# Patient Record
Sex: Female | Born: 1980 | Hispanic: Yes | Smoking: Never smoker
Health system: Southern US, Community
[De-identification: ages and names within clinical notes are randomized; demographics above are authoritative.]

---

## 2009-02-07 ENCOUNTER — Ambulatory Visit: Payer: Self-pay | Admitting: Family Medicine

## 2009-02-26 ENCOUNTER — Inpatient Hospital Stay: Payer: Self-pay

## 2010-04-08 ENCOUNTER — Emergency Department: Payer: Self-pay | Admitting: Unknown Physician Specialty

## 2017-12-10 LAB — OB RESULTS CONSOLE HEPATITIS B SURFACE ANTIGEN: HEP B S AG: NEGATIVE

## 2017-12-10 LAB — OB RESULTS CONSOLE RPR: RPR: NONREACTIVE

## 2017-12-27 ENCOUNTER — Other Ambulatory Visit: Payer: Self-pay | Admitting: Physician Assistant

## 2017-12-27 DIAGNOSIS — Z369 Encounter for antenatal screening, unspecified: Secondary | ICD-10-CM

## 2018-01-06 ENCOUNTER — Ambulatory Visit (HOSPITAL_BASED_OUTPATIENT_CLINIC_OR_DEPARTMENT_OTHER)
Admission: RE | Admit: 2018-01-06 | Discharge: 2018-01-06 | Disposition: A | Discharge status: Home / Self Care | Payer: Self-pay | Source: Ambulatory Visit | Attending: Physician Assistant | Admitting: Physician Assistant

## 2018-01-06 ENCOUNTER — Encounter: Payer: Self-pay | Admitting: *Deleted

## 2018-01-06 ENCOUNTER — Ambulatory Visit
Admission: RE | Admit: 2018-01-06 | Discharge: 2018-01-06 | Disposition: A | Discharge status: Home / Self Care | Payer: Self-pay | Source: Ambulatory Visit | Attending: Maternal & Fetal Medicine | Admitting: Maternal & Fetal Medicine

## 2018-01-06 DIAGNOSIS — Z3A13 13 weeks gestation of pregnancy: Secondary | ICD-10-CM | POA: Insufficient documentation

## 2018-01-06 DIAGNOSIS — O09521 Supervision of elderly multigravida, first trimester: Secondary | ICD-10-CM

## 2018-01-06 DIAGNOSIS — Z369 Encounter for antenatal screening, unspecified: Secondary | ICD-10-CM

## 2018-01-06 DIAGNOSIS — Z3401 Encounter for supervision of normal first pregnancy, first trimester: Secondary | ICD-10-CM | POA: Insufficient documentation

## 2018-01-06 NOTE — Progress Notes (Signed)
Referring Provider:  Terance Ice Length of Consultation: 80 minutes  [name] was referred to [facility] for genetic counseling because of advanced maternal age.  The patient will be 37 years old at the time of delivery.  This note summarizes the information we discussed with the aid of a Spanish interpreter.    We explained that the chance of a chromosome abnormality increases with maternal age.  Chromosomes and examples of chromosome problems were reviewed.  Humans typically have 46 chromosomes in each cell, with half passed through each sperm and egg.  Any change in the number or structure of chromosomes can increase the risk of problems in the physical and mental development of a pregnancy.   Based upon age of the patient, the chance of any chromosome abnormality was 1 in 36. The chance of Down syndrome, the most common chromosome problem associated with maternal age, was 1 in 30.  The risk of chromosome problems is in addition to the 3% general population risk for birth defects and mental retardation.  The greatest chance, of course, is that the baby would be born in good health.  We discussed the following prenatal screening and testing options for this pregnancy:  First trimester screening, which includes nuchal translucency ultrasound screen and first trimester maternal serum marker screening.  The nuchal translucency has approximately an 80% detection rate for Down syndrome and can be positive for other chromosome abnormalities as well as heart defects.  When combined with a maternal serum marker screening, the detection rate is up to 90% for Down syndrome and up to 97% for trisomy 18.     The chorionic villus sampling procedure is available for first trimester chromosome analysis.  This involves the withdrawal of a small amount of chorionic villi (tissue from the developing placenta).  Risk of pregnancy loss is estimated to be approximately 1  in 200 to 1 in 100 (0.5 to 1%).  There is approximately a 1% (1 in 100) chance that the CVS chromosome results will be unclear.  Chorionic villi cannot be tested for neural tube defects.     Maternal serum marker screening, a blood test that measures pregnancy proteins, can provide risk assessments for Down syndrome, trisomy 18, and open neural tube defects (spina bifida, anencephaly). Because it does not directly examine the fetus, it cannot positively diagnose or rule out these problems.  Targeted ultrasound uses high frequency sound waves to create an image of the developing fetus.  An ultrasound is often recommended as a routine means of evaluating the pregnancy.  It is also used to screen for fetal anatomy problems (for example, a heart defect) that might be suggestive of a chromosomal or other abnormality.   Amniocentesis involves the removal of a small amount of amniotic fluid from the sac surrounding the fetus with the use of a thin needle inserted through the maternal abdomen and uterus.  Ultrasound guidance is used throughout the procedure.  Fetal cells from amniotic fluid are directly evaluated and > 99.5% of chromosome problems and > 98% of open neural tube defects can be detected. This procedure is generally performed after the 15th week of pregnancy.  The main risks to this procedure include complications leading to miscarriage in less than 1 in 200 cases (0.5%).  We also reviewed the availability of cell free fetal DNA testing from maternal blood to determine whether or not the baby may have either Down syndrome, trisomy 2, or trisomy 31.  This  test utilizes a maternal blood sample and DNA sequencing technology to isolate circulating cell free fetal DNA from maternal plasma.  The fetal DNA can then be analyzed for DNA sequences that are derived from the three most common chromosomes involved in aneuploidy, chromosomes 13, 18, and 21.  If the overall amount of DNA is greater than the expected  level for any of these chromosomes, aneuploidy is suspected.  While we do not consider it a replacement for invasive testing and karyotype analysis, a negative result from this testing would be reassuring, though not a guarantee of a normal chromosome complement for the baby.  An abnormal result is certainly suggestive of an abnormal chromosome complement, though we would still recommend CVS or amniocentesis to confirm any findings from this testing.  Cystic Fibrosis and Spinal Muscular Atrophy (SMA) screening were also discussed with the patient. Both conditions are recessive, which means that both parents must be carriers in order to have a child with the disease.  Cystic fibrosis (CF) is one of the most common genetic conditions in persons of Caucasian ancestry.  This condition occurs in approximately 1 in 2,500 Caucasian persons and results in thickened secretions in the lungs, digestive, and reproductive systems.  For a baby to be at risk for having CF, both of the parents must be carriers for this condition.  Approximately 1 in 8225 Caucasian persons is a carrier for CF.  Current carrier testing looks for the most common mutations in the gene for CF and can detect approximately 90% of carriers in the Caucasian population.  This means that the carrier screening can greatly reduce, but cannot eliminate, the chance for an individual to have a child with CF.  If an individual is found to be a carrier for CF, then carrier testing would be available for the partner. As part of [place]'s newborn screening profile, all babies born in the state of [place] will have a two-tier screening process.  Specimens are first tested to determine the concentration of immunoreactive trypsinogen (IRT).  The top 5% of specimens with the highest IRT values then undergo DNA testing using a panel of over 40 common CF mutations. SMA is a neurodegenerative disorder that leads to atrophy of skeletal muscle and overall  weakness.  This condition is also more prevalent in the Caucasian population, with 1 in 40-1 in 60 persons being a carrier and 1 in 6,000-1 in 10,000 children being affected.  There are multiple forms of the disease, with some causing death in infancy to other forms with survival into adulthood.  The genetics of SMA is complex, but carrier screening can detect up to 95% of carriers in the Caucasian population.  Similar to CF, a negative result can greatly reduce, but cannot eliminate, the chance to have a child with SMA.  We obtained a detailed family history and pregnancy history.  Ms. Sharon MilanBahena Sharon reported that her brother has a 35 year old son with a congenital heart defect, intestinal issues requiring surgery, delays with speaking and possibly slanted eyes.  She indicated that here had been some question about Down syndrome, but that the family has not stated that any specific underlying cause is known. The child's mother was 508 years old at the time of his delivery.  There are no other family members with similar health concerns.  We discussed that there may be various reasons for the conditions described.  Though babies with Down syndrome may have these health concerns, there are also other  conditions that could be the cause.  Without additional medical information, it is difficult to provide an accurate recurrence risk assessment for other family members including this pregnancy.  We can offer an anatomy ultrasound to evaluate for structural differences including heart defects and possibly anomalies of the intestines.  We encouraged the patient to speak with her brother and let us know any additional information.  The remainder of the family history is unremarkable for birth defects, developmental delays, recurrent pregnancy loss or known chromosome abnormalities.  [name] stated that this is the third pregnancy for she and her partner.  She reported no complications or exposures to medications,  recreational drugs, smoking or alcohol.  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  She has thus far tried to minimize her exposures there.  We encouraged her to use good safety precautions such as gloves, hand washing and masks and to avoid sniffing any chemicals for recreational purposes, which should minimize any exposure to the pregnancy.  After consideration of the options, [name] elected to proceed with cell free fetal DNA testing and an ultrasound.   An ultrasound was performed at the time of the visit.  The gestational age was consistent with  13 weeks.  Fetal anatomy could not be assessed due to early gestational age.  Please refer to the ultrasound report for details of that study.  [name] was scheduled to return here for a detailed anatomy ultrasound at [redacted] weeks gestation.  Of note, this is after her presumptive medicaid expires but she was only going to be 15 weeks at the time it ends, which is too early to expect appropriate views of the fetal anatomy.  [name] was encouraged to call with questions or concerns.  We can be contacted at [phone].   Tests Ordered: MaterniT21 PLUS with [facility]    Cherly Anderson, MS, CGC

## 2018-01-12 LAB — MATERNIT21 PLUS CORE+SCA
CHROMOSOME 13: NEGATIVE
CHROMOSOME 18: NEGATIVE
CHROMOSOME 21: NEGATIVE
Y CHROMOSOME: DETECTED

## 2018-01-17 ENCOUNTER — Telehealth: Payer: Self-pay | Admitting: Obstetrics and Gynecology

## 2018-01-17 NOTE — Telephone Encounter (Signed)
The patient was informed of the results of her recent MaterniT21 testing which yielded NEGATIVE results.  The patient's specimen showed DNA consistent with two copies of chromosomes 21, 18 and 13.  The sensitivity for trisomy 21, trisomy 18 and trisomy 13 using this testing are reported as 99.1%, 99.9% and 91.7% respectively.  Thus, while the results of this testing are highly accurate, they are not considered diagnostic at this time.  Should more definitive information be desired, the patient may still consider amniocentesis.   As requested to know by the patient, sex chromosome analysis was included for this sample.  Results was consistent with a female fetus. This is predicted with >99% accuracy.  A maternal serum AFP only should be considered if screening for neural tube defects is desired.   Othel Hoogendoorn F. Suann Klier, MS, CGC   

## 2018-02-07 ENCOUNTER — Other Ambulatory Visit: Payer: Self-pay | Admitting: *Deleted

## 2018-02-07 DIAGNOSIS — O09521 Supervision of elderly multigravida, first trimester: Secondary | ICD-10-CM

## 2018-02-10 ENCOUNTER — Ambulatory Visit
Admission: RE | Admit: 2018-02-10 | Discharge: 2018-02-10 | Disposition: A | Discharge status: Home / Self Care | Payer: Self-pay | Source: Ambulatory Visit | Attending: Maternal & Fetal Medicine | Admitting: Maternal & Fetal Medicine

## 2018-02-10 DIAGNOSIS — O3482 Maternal care for other abnormalities of pelvic organs, second trimester: Secondary | ICD-10-CM | POA: Insufficient documentation

## 2018-02-10 DIAGNOSIS — N83292 Other ovarian cyst, left side: Secondary | ICD-10-CM | POA: Insufficient documentation

## 2018-02-10 DIAGNOSIS — Z3A18 18 weeks gestation of pregnancy: Secondary | ICD-10-CM | POA: Insufficient documentation

## 2018-02-10 DIAGNOSIS — O09521 Supervision of elderly multigravida, first trimester: Secondary | ICD-10-CM

## 2018-02-10 DIAGNOSIS — O09522 Supervision of elderly multigravida, second trimester: Secondary | ICD-10-CM | POA: Insufficient documentation

## 2018-02-14 ENCOUNTER — Encounter: Payer: Self-pay | Admitting: Emergency Medicine

## 2018-02-14 ENCOUNTER — Other Ambulatory Visit: Payer: Self-pay

## 2018-02-14 ENCOUNTER — Emergency Department
Admission: EM | Admit: 2018-02-14 | Discharge: 2018-02-14 | Disposition: A | Discharge status: Home / Self Care | Payer: Self-pay | Attending: Emergency Medicine | Admitting: Emergency Medicine

## 2018-02-14 DIAGNOSIS — O26899 Other specified pregnancy related conditions, unspecified trimester: Secondary | ICD-10-CM | POA: Insufficient documentation

## 2018-02-14 DIAGNOSIS — O2340 Unspecified infection of urinary tract in pregnancy, unspecified trimester: Secondary | ICD-10-CM | POA: Insufficient documentation

## 2018-02-14 DIAGNOSIS — R197 Diarrhea, unspecified: Secondary | ICD-10-CM

## 2018-02-14 DIAGNOSIS — N390 Urinary tract infection, site not specified: Secondary | ICD-10-CM

## 2018-02-14 DIAGNOSIS — Z3A18 18 weeks gestation of pregnancy: Secondary | ICD-10-CM | POA: Insufficient documentation

## 2018-02-14 DIAGNOSIS — R112 Nausea with vomiting, unspecified: Secondary | ICD-10-CM

## 2018-02-14 DIAGNOSIS — O219 Vomiting of pregnancy, unspecified: Secondary | ICD-10-CM | POA: Insufficient documentation

## 2018-02-14 LAB — URINALYSIS, COMPLETE (UACMP) WITH MICROSCOPIC
Bilirubin Urine: NEGATIVE
Glucose, UA: NEGATIVE mg/dL
Hgb urine dipstick: NEGATIVE
Ketones, ur: NEGATIVE mg/dL
Nitrite: NEGATIVE
PROTEIN: NEGATIVE mg/dL
Specific Gravity, Urine: 1.005 (ref 1.005–1.030)
pH: 7 (ref 5.0–8.0)

## 2018-02-14 LAB — COMPREHENSIVE METABOLIC PANEL
ALK PHOS: 45 U/L (ref 38–126)
ALT: 19 U/L (ref 14–54)
AST: 22 U/L (ref 15–41)
Albumin: 3.5 g/dL (ref 3.5–5.0)
Anion gap: 7 (ref 5–15)
BUN: 7 mg/dL (ref 6–20)
CALCIUM: 8.6 mg/dL — AB (ref 8.9–10.3)
CHLORIDE: 108 mmol/L (ref 101–111)
CO2: 20 mmol/L — ABNORMAL LOW (ref 22–32)
CREATININE: 0.43 mg/dL — AB (ref 0.44–1.00)
GFR calc Af Amer: >60 mL/min (ref >60)
GFR calc non Af Amer: >60 mL/min (ref >60)
Glucose, Bld: 96 mg/dL (ref 65–99)
Potassium: 3.3 mmol/L — ABNORMAL LOW (ref 3.5–5.1)
SODIUM: 135 mmol/L (ref 135–145)
Total Bilirubin: 0.6 mg/dL (ref 0.3–1.2)
Total Protein: 6.7 g/dL (ref 6.5–8.1)

## 2018-02-14 LAB — CBC
HCT: 35.4 % (ref 35.0–47.0)
HEMOGLOBIN: 12.5 g/dL (ref 12.0–16.0)
MCH: 30.2 pg (ref 26.0–34.0)
MCHC: 35.2 g/dL (ref 32.0–36.0)
MCV: 85.7 fL (ref 80.0–100.0)
PLATELETS: 230 10*3/uL (ref 150–440)
RBC: 4.14 MIL/uL (ref 3.80–5.20)
RDW: 14.4 % (ref 11.5–14.5)
WBC: 11.6 10*3/uL — ABNORMAL HIGH (ref 3.6–11.0)

## 2018-02-14 LAB — CK: CK TOTAL: 84 U/L (ref 38–234)

## 2018-02-14 LAB — HCG, QUANTITATIVE, PREGNANCY: hCG, Beta Chain, Quant, S: 15109 m[IU]/mL — ABNORMAL HIGH (ref <5)

## 2018-02-14 LAB — LIPASE, BLOOD: LIPASE: 19 U/L (ref 11–51)

## 2018-02-14 MED ORDER — NITROFURANTOIN MONOHYD MACRO 100 MG PO CAPS: 100.0000 mg | ORAL_CAPSULE | Freq: Two times a day (BID) | ORAL | 0 refills | Status: AC

## 2018-02-14 MED ORDER — ONDANSETRON 4 MG PO TBDP: 4.0000 mg | ORAL_TABLET | Freq: Three times a day (TID) | ORAL | 0 refills | Status: AC | PRN

## 2018-02-14 MED ORDER — ONDANSETRON HCL 4 MG/2ML IJ SOLN
4.0000 mg | Freq: Once | INTRAMUSCULAR | Status: AC
  Administered 2018-02-14: 4 mg via INTRAVENOUS
  Filled 2018-02-14: qty 2

## 2018-02-14 MED ORDER — SODIUM CHLORIDE 0.9 % IV BOLUS
1000.0000 mL | Freq: Once | INTRAVENOUS | Status: AC
  Administered 2018-02-14: 1000 mL via INTRAVENOUS

## 2018-02-14 MED ORDER — SODIUM CHLORIDE 0.9 % IV SOLN
1.0000 g | Freq: Once | INTRAVENOUS | Status: AC
  Administered 2018-02-14: 1 g via INTRAVENOUS
  Filled 2018-02-14: qty 10

## 2018-02-14 NOTE — ED Triage Notes (Signed)
Through interpretor, Patient is [redacted] weeks pregnant. Presents to ED for complaints of diarrhea and nausea. Onset Sunday afternoon. Appetite is decreased. States she was outside in the heat for most of the day and despite drinking Gatorade she started having nausea. Thinks the heat may have caused all these symptoms. Denies any pregnancy related complaints

## 2018-02-14 NOTE — Discharge Instructions (Addendum)
1.  Take antibiotic as prescribed (Macrobid 100 mg twice daily for 5 days). 2.  You may take Zofran as needed for nausea/vomiting. 3.  Clear liquids for the next 12 hours, then BRAT diet for the next 3 days, then slowly advance diet as tolerated.  Avoid greasy, fatty, spicy foods and alcohol. 4.  Return to the ER for worsening symptoms, persistent vomiting, difficulty breathing or other concerns.

## 2018-02-14 NOTE — ED Provider Notes (Signed)
[facility] Emergency Department Provider Note   ____________________________________________   First MD Initiated Contact with Patient 02/14/18 0142     (approximate)  I have reviewed the triage vital signs and the nursing notes.   HISTORY  Chief Complaint Diarrhea and Nausea  History obtained Via [employer] Spanish interpreter  HPI [name] is a 37 y.o. female who presents to the ED from home with a chief complaint of nausea/vomiting/diarrhea.  Patient is G3 P2 approximately [redacted] weeks pregnant who states she was outside in the heat for most of the day yesterday.  Despite hydrating with Gatorade, subsequently she began to have nausea and vomiting.  States diarrhea is worse than the vomiting.  Denies associated fever, chills, chest pain, shortness of breath, abdominal pain, vaginal bleeding, dysuria.  Denies recent travel or trauma.   Past medical history None   Patient Active Problem List   Diagnosis Date Noted  . Advanced maternal age in multigravida, first trimester     History reviewed. No pertinent surgical history.  Prior to Admission medications   Medication Sig Start Date End Date Taking? Authorizing Provider  Prenatal Vit-Fe Fumarate-FA (PRENATAL MULTIVITAMIN) TABS tablet Take 1 tablet by mouth daily at 12 noon.   Yes [provider]    Allergies Patient has no known allergies.  No family history on file.  Social History Social History   Tobacco Use  . Smoking status: Never Smoker  . Smokeless tobacco: Never Used  Substance Use Topics  . Alcohol use: Never    Frequency: Never  . Drug use: Never    Review of Systems  Constitutional: No fever/chills. Eyes: No visual changes. ENT: No sore throat. Cardiovascular: Denies chest pain. Respiratory: Denies shortness of breath. Gastrointestinal: No abdominal pain.  Via, vomiting and diarrhea.  No constipation. Genitourinary: Negative for  dysuria. Musculoskeletal: Negative for back pain. Skin: Negative for rash. Neurological: Negative for headaches, focal weakness or numbness.   ____________________________________________   PHYSICAL EXAM:  VITAL SIGNS: ED Triage Vitals [02/14/18 0113]  Enc Vitals Group     BP      Pulse      Resp      Temp      Temp src      SpO2      Weight 145 lb (65.8 kg)     Height  (1.6 m)     Head Circumference      Peak Flow      Pain Score 0     Pain Loc      Pain Edu?      Excl. in GC?     Constitutional: Alert and oriented. Well appearing and in no acute distress. Eyes: Conjunctivae are normal. PERRL. EOMI. Head: Atraumatic. Nose: No congestion/rhinnorhea. Mouth/Throat: Mucous membranes are moist.  Oropharynx non-erythematous. Neck: No stridor.   Cardiovascular: Normal rate, regular rhythm. Grossly normal heart sounds.  Good peripheral circulation. Respiratory: Normal respiratory effort.  No retractions. Lungs CTAB. Gastrointestinal: Gravid.  Soft and nontender to light or deep palpation. No distention. No abdominal bruits. No CVA tenderness. Musculoskeletal: No lower extremity tenderness nor edema.  No joint effusions. Neurologic:  Normal speech and language. No gross focal neurologic deficits are appreciated. No gait instability. Skin:  Skin is warm, dry and intact. No rash noted. Psychiatric: Mood and affect are normal. Speech and behavior are normal.  ____________________________________________   LABS (all labs ordered are listed, but only abnormal results are displayed)  Labs Reviewed  CBC - Abnormal; Notable for the following components:      Result Value   WBC 11.6 (*)    All other components within normal limits  COMPREHENSIVE METABOLIC PANEL - Abnormal; Notable for the following components:   Potassium 3.3 (*)    CO2 20 (*)    Creatinine, Ser 0.43 (*)    Calcium 8.6 (*)    All other components within normal limits  HCG, QUANTITATIVE, PREGNANCY -  Abnormal; Notable for the following components:   hCG, Beta Chain, Quant, S 15,109 (*)    All other components within normal limits  URINALYSIS, COMPLETE (UACMP) WITH MICROSCOPIC - Abnormal; Notable for the following components:   Color, Urine STRAW (*)    APPearance CLEAR (*)    Leukocytes, UA SMALL (*)    Bacteria, UA RARE (*)    All other components within normal limits  LIPASE, BLOOD  CK   ____________________________________________  EKG  None ____________________________________________  RADIOLOGY  ED MD interpretation: None  Official radiology report(s): No results found.  ____________________________________________   PROCEDURES  Procedure(s) performed: None  Procedures  Critical Care performed: No  ____________________________________________   INITIAL IMPRESSION / ASSESSMENT AND PLAN / ED COURSE  As part of my medical decision making, I reviewed the following data within the electronic MEDICAL RECORD NUMBER Nursing notes reviewed and incorporated, Interpreter needed, Labs reviewed, Old chart reviewed and Notes from prior ED visits   37 year old female approximately [redacted] weeks pregnant who presents with nausea/vomiting/diarrhea.  Reports she was out in the heat all day yesterday.  Differential diagnosis includes but is not limited to gastroenteritis, HELLP syndrome, rhabdomyolysis, heat illness, etc.   Will obtain lab work, urinalysis, check CK.  Will initiate IV fluid resuscitation, IV Zofran for antiemetic and reassess.   Clinical Course as of Feb 15 343  Mon Feb 14, 2018  0344 Nausea significantly improved.  Patient tolerated ice chips without emesis.  Will discharge home with prescription for Macrobid, Zofran as needed and she will follow-up closely with her OB/GYN.  Strict return precautions given.  Patient and family member verbalize understanding and agree with plan of care.   [JS]    Clinical Course User Index [JS] Irean Hong, MD      ____________________________________________   FINAL CLINICAL IMPRESSION(S) / ED DIAGNOSES  Final diagnoses:  Nausea vomiting and diarrhea  Lower urinary tract infectious disease     ED Discharge Orders    None       Note:  This document was prepared using Dragon voice recognition software and may include unintentional dictation errors.    Irean Hong, MD 02/14/18 [phone]

## 2018-02-24 NOTE — Progress Notes (Signed)
Pt seen by me, agree with assessment and plan as outlined in CGC Wells's note 

## 2018-04-14 LAB — OB RESULTS CONSOLE HIV ANTIBODY (ROUTINE TESTING): HIV: NONREACTIVE

## 2018-06-12 LAB — OB RESULTS CONSOLE GBS: GBS: NEGATIVE

## 2018-06-15 LAB — OB RESULTS CONSOLE GC/CHLAMYDIA
CHLAMYDIA, DNA PROBE: NEGATIVE
Gonorrhea: NEGATIVE

## 2018-07-13 ENCOUNTER — Other Ambulatory Visit: Payer: Self-pay | Admitting: Obstetrics and Gynecology

## 2018-07-13 NOTE — Progress Notes (Unsigned)
Orders placed for IOL on 07/20/18

## 2018-07-18 ENCOUNTER — Other Ambulatory Visit: Payer: Self-pay

## 2018-07-18 ENCOUNTER — Inpatient Hospital Stay
Admission: EM | Admit: 2018-07-18 | Discharge: 2018-07-22 | DRG: 788 | Disposition: A | Discharge status: Home / Self Care | Payer: Medicaid Other | Attending: Obstetrics and Gynecology | Admitting: Obstetrics and Gynecology

## 2018-07-18 DIAGNOSIS — O339 Maternal care for disproportion, unspecified: Secondary | ICD-10-CM | POA: Diagnosis present

## 2018-07-18 DIAGNOSIS — Z3A41 41 weeks gestation of pregnancy: Secondary | ICD-10-CM

## 2018-07-18 DIAGNOSIS — Z3490 Encounter for supervision of normal pregnancy, unspecified, unspecified trimester: Secondary | ICD-10-CM

## 2018-07-18 DIAGNOSIS — O480 Post-term pregnancy: Secondary | ICD-10-CM | POA: Diagnosis present

## 2018-07-18 DIAGNOSIS — Z9889 Other specified postprocedural states: Secondary | ICD-10-CM

## 2018-07-18 LAB — CBC
HCT: 38.8 % (ref 36.0–46.0)
Hemoglobin: 13.3 g/dL (ref 12.0–15.0)
MCH: 29.9 pg (ref 26.0–34.0)
MCHC: 34.3 g/dL (ref 30.0–36.0)
MCV: 87.2 fL (ref 80.0–100.0)
PLATELETS: 195 10*3/uL (ref 150–400)
RBC: 4.45 MIL/uL (ref 3.87–5.11)
RDW: 14.2 % (ref 11.5–15.5)
WBC: 11.1 10*3/uL — ABNORMAL HIGH (ref 4.0–10.5)
nRBC: 0 % (ref 0.0–0.2)

## 2018-07-18 LAB — TYPE AND SCREEN
ABO/RH(D): O POS
Antibody Screen: NEGATIVE

## 2018-07-18 MED ORDER — LACTATED RINGERS IV SOLN: 500.0000 mL | INTRAVENOUS | Status: DC | PRN

## 2018-07-18 MED ORDER — ONDANSETRON HCL 4 MG/2ML IJ SOLN: 4.0000 mg | Freq: Four times a day (QID) | INTRAMUSCULAR | Status: DC | PRN

## 2018-07-18 MED ORDER — MISOPROSTOL 200 MCG PO TABS
ORAL_TABLET | ORAL | Status: AC
  Filled 2018-07-18: qty 4

## 2018-07-18 MED ORDER — SOD CITRATE-CITRIC ACID 500-334 MG/5ML PO SOLN
30.0000 mL | ORAL | Status: DC | PRN
  Administered 2018-07-19: 30 mL via ORAL
  Filled 2018-07-18: qty 30

## 2018-07-18 MED ORDER — AMMONIA AROMATIC IN INHA
RESPIRATORY_TRACT | Status: AC
  Filled 2018-07-18: qty 10

## 2018-07-18 MED ORDER — OXYTOCIN 10 UNIT/ML IJ SOLN
INTRAMUSCULAR | Status: AC
  Filled 2018-07-18: qty 2

## 2018-07-18 MED ORDER — MISOPROSTOL 25 MCG QUARTER TABLET
ORAL_TABLET | ORAL | Status: AC
  Filled 2018-07-18: qty 1

## 2018-07-18 MED ORDER — BUTORPHANOL TARTRATE 2 MG/ML IJ SOLN
1.0000 mg | INTRAMUSCULAR | Status: DC | PRN
  Administered 2018-07-19: 1 mg via INTRAVENOUS
  Filled 2018-07-18: qty 1

## 2018-07-18 MED ORDER — LIDOCAINE HCL (PF) 1 % IJ SOLN: 30.0000 mL | INTRAMUSCULAR | Status: DC | PRN

## 2018-07-18 MED ORDER — OXYTOCIN BOLUS FROM INFUSION: 500.0000 mL | Freq: Once | INTRAVENOUS | Status: DC

## 2018-07-18 MED ORDER — OXYTOCIN 40 UNITS IN LACTATED RINGERS INFUSION - SIMPLE MED
2.5000 [IU]/h | INTRAVENOUS | Status: DC
  Administered 2018-07-19: 1000 mL via INTRAVENOUS
  Filled 2018-07-18: qty 1000

## 2018-07-18 MED ORDER — SODIUM CHLORIDE 0.9 % IV SOLN: 2.0000 g | Freq: Once | INTRAVENOUS | Status: DC

## 2018-07-18 MED ORDER — MISOPROSTOL 25 MCG QUARTER TABLET
25.0000 ug | ORAL_TABLET | ORAL | Status: DC
  Administered 2018-07-18 – 2018-07-19 (×2): 25 ug via ORAL
  Filled 2018-07-18: qty 1

## 2018-07-18 MED ORDER — LIDOCAINE HCL (PF) 1 % IJ SOLN
INTRAMUSCULAR | Status: AC
  Filled 2018-07-18: qty 30

## 2018-07-18 MED ORDER — ACETAMINOPHEN 325 MG PO TABS: 650.0000 mg | ORAL_TABLET | ORAL | Status: DC | PRN

## 2018-07-18 MED ORDER — LACTATED RINGERS IV SOLN
INTRAVENOUS | Status: DC
  Administered 2018-07-18 – 2018-07-19 (×3): via INTRAVENOUS

## 2018-07-18 NOTE — H&P (Signed)
OB ADMISSION/ HISTORY & PHYSICAL:  Admission Date: 07/18/2018  4:31 PM  Admit Diagnosis: Non reactive NST  [name] is a 37 y.o. female presenting for nonreassuring fetal testing as an outpatient and found to have a late decel on monitoring in triage.  Pt was seen with medical interpreter and all her and her partner's questions were answered.  Prenatal History: [id number]   EDC : 07/10/2018, by Patient Reported  Prenatal care at  Prenatal course complicated by  - AMA - Late term pregnancy   Medical / Surgical History :  Past medical history: History reviewed. No pertinent past medical history.   Past surgical history: History reviewed. No pertinent surgical history.  Family History: History reviewed. No pertinent family history.   Social History:  reports that she has never smoked. She has never used smokeless tobacco. She reports that she does not drink alcohol or use drugs.   Allergies: Patient has no allergy information on record.    Current Medications at time of admission:  Prior to Admission medications   Medication Sig Start Date End Date Taking? Authorizing Provider  Prenatal Vit-Fe Fumarate-FA (MULTIVITAMIN-PRENATAL) 27-0.8 MG TABS tablet Take 1 tablet by mouth daily at 12 noon.   Yes [provider]     Review of Systems: Active FM  Physical Exam:  VS: Blood pressure 129/85, pulse 82, temperature 98 F (36.7 C), temperature source Oral, resp. rate 18.  General: alert and oriented, appears NAD Heart: RRR Lungs: Clear lung fields Abdomen: Gravid, soft and non-tender, non-distended / uterus: Nontender Extremities: no edema  FHT: 145, moderate variability, +accels, +late decels TOCO: q2-5 min SVE:  Dilation: Fingertip / Effacement (%): Thick /      Cephalic by leopolds  Prenatal Labs: Blood type/Rh  Rh positive  Antibody screen neg  Rubella Immune  Varicella Immune  RPR NR  HBsAg Neg  HIV NR  GC neg  Chlamydia neg  Genetic  screening negative  1 hour GTT Unknown but not noted positive in records  3 hour GTT n/a  GBS neg   No results found. - pending  Assessment: 41+[redacted] weeks gestation FHR category Cat II   Plan:  Admit for induction of labor Labs pending Epidural when desired Continuous fetal monitoring   1. Fetal Well being  - Ultrasound: normal anatomy reviewed, as above - Group B Streptococcus: neg - Presentation: vtx confirmed by Leopolds   2. Routine OB: - Prenatal labs reviewed, as above - Rh positive  3. Induction of Labor:  -  Contractions external toco in place -  Pelvis proven to 7#11oz -  Plan for induction with cytotec  4. AMA: normal anatomy scan, cffDNA negative in 1st trimester

## 2018-07-19 ENCOUNTER — Inpatient Hospital Stay: Payer: Medicaid Other | Admitting: Anesthesiology

## 2018-07-19 ENCOUNTER — Encounter
Admission: EM | Disposition: A | Discharge status: Home / Self Care | Payer: Self-pay | Source: Home / Self Care | Attending: Obstetrics and Gynecology

## 2018-07-19 DIAGNOSIS — Z9889 Other specified postprocedural states: Secondary | ICD-10-CM

## 2018-07-19 SURGERY — Surgical Case
Anesthesia: Spinal

## 2018-07-19 MED ORDER — WITCH HAZEL-GLYCERIN EX PADS: 1.0000 "application " | MEDICATED_PAD | CUTANEOUS | Status: DC | PRN

## 2018-07-19 MED ORDER — SODIUM CHLORIDE 0.9% FLUSH: 3.0000 mL | INTRAVENOUS | Status: DC | PRN

## 2018-07-19 MED ORDER — NALBUPHINE HCL 10 MG/ML IJ SOLN: 5.0000 mg | INTRAMUSCULAR | Status: DC | PRN

## 2018-07-19 MED ORDER — DIPHENHYDRAMINE HCL 25 MG PO CAPS
25.0000 mg | ORAL_CAPSULE | Freq: Four times a day (QID) | ORAL | Status: DC | PRN
  Administered 2018-07-22: 25 mg via ORAL
  Filled 2018-07-19: qty 1

## 2018-07-19 MED ORDER — BUPIVACAINE IN DEXTROSE 0.75-8.25 % IT SOLN
INTRATHECAL | Status: DC | PRN
  Administered 2018-07-19: 1.6 mL via INTRATHECAL

## 2018-07-19 MED ORDER — ONDANSETRON HCL 4 MG/2ML IJ SOLN
INTRAMUSCULAR | Status: AC
  Filled 2018-07-19: qty 2

## 2018-07-19 MED ORDER — DIBUCAINE 1 % RE OINT: 1.0000 "application " | TOPICAL_OINTMENT | RECTAL | Status: DC | PRN

## 2018-07-19 MED ORDER — MEPERIDINE HCL 25 MG/ML IJ SOLN: 6.2500 mg | INTRAMUSCULAR | Status: DC | PRN

## 2018-07-19 MED ORDER — BUPIVACAINE HCL (PF) 0.5 % IJ SOLN
INTRAMUSCULAR | Status: AC
  Filled 2018-07-19: qty 30

## 2018-07-19 MED ORDER — NALBUPHINE HCL 10 MG/ML IJ SOLN: 5.0000 mg | Freq: Once | INTRAMUSCULAR | Status: DC | PRN

## 2018-07-19 MED ORDER — MENTHOL 3 MG MT LOZG
1.0000 | LOZENGE | OROMUCOSAL | Status: DC | PRN
  Filled 2018-07-19: qty 9

## 2018-07-19 MED ORDER — NALOXONE HCL 4 MG/10ML IJ SOLN
1.0000 ug/kg/h | INTRAVENOUS | Status: DC | PRN
  Filled 2018-07-19: qty 5

## 2018-07-19 MED ORDER — OXYTOCIN 40 UNITS IN LACTATED RINGERS INFUSION - SIMPLE MED
2.5000 [IU]/h | INTRAVENOUS | Status: DC
  Administered 2018-07-19: 2.5 [IU]/h via INTRAVENOUS
  Filled 2018-07-19: qty 1000

## 2018-07-19 MED ORDER — SENNOSIDES-DOCUSATE SODIUM 8.6-50 MG PO TABS
2.0000 | ORAL_TABLET | ORAL | Status: DC
  Administered 2018-07-19 – 2018-07-21 (×3): 2 via ORAL
  Filled 2018-07-19 (×4): qty 2

## 2018-07-19 MED ORDER — SIMETHICONE 80 MG PO CHEW
80.0000 mg | CHEWABLE_TABLET | Freq: Three times a day (TID) | ORAL | Status: DC
  Administered 2018-07-19 – 2018-07-22 (×9): 80 mg via ORAL
  Filled 2018-07-19 (×9): qty 1

## 2018-07-19 MED ORDER — MORPHINE SULFATE (PF) 0.5 MG/ML IJ SOLN
INTRAMUSCULAR | Status: DC | PRN
  Administered 2018-07-19: .1 mg via EPIDURAL

## 2018-07-19 MED ORDER — DIPHENHYDRAMINE HCL 25 MG PO CAPS: 25.0000 mg | ORAL_CAPSULE | ORAL | Status: DC | PRN

## 2018-07-19 MED ORDER — SIMETHICONE 80 MG PO CHEW
80.0000 mg | CHEWABLE_TABLET | ORAL | Status: DC
  Administered 2018-07-19 – 2018-07-21 (×3): 80 mg via ORAL
  Filled 2018-07-19 (×3): qty 1

## 2018-07-19 MED ORDER — KETOROLAC TROMETHAMINE 30 MG/ML IJ SOLN
30.0000 mg | Freq: Four times a day (QID) | INTRAMUSCULAR | Status: DC | PRN
  Administered 2018-07-19 – 2018-07-20 (×4): 30 mg via INTRAVENOUS
  Filled 2018-07-19 (×3): qty 1

## 2018-07-19 MED ORDER — NALOXONE HCL 0.4 MG/ML IJ SOLN: 0.4000 mg | INTRAMUSCULAR | Status: DC | PRN

## 2018-07-19 MED ORDER — ACETAMINOPHEN 500 MG PO TABS
1000.0000 mg | ORAL_TABLET | Freq: Four times a day (QID) | ORAL | Status: DC
  Administered 2018-07-19 – 2018-07-20 (×3): 1000 mg via ORAL
  Filled 2018-07-19 (×3): qty 2

## 2018-07-19 MED ORDER — TETANUS-DIPHTH-ACELL PERTUSSIS 5-2.5-18.5 LF-MCG/0.5 IM SUSP
0.5000 mL | Freq: Once | INTRAMUSCULAR | Status: DC
  Filled 2018-07-19: qty 0.5

## 2018-07-19 MED ORDER — COCONUT OIL OIL: 1.0000 "application " | TOPICAL_OIL | Status: DC | PRN

## 2018-07-19 MED ORDER — FENTANYL CITRATE (PF) 100 MCG/2ML IJ SOLN: 25.0000 ug | INTRAMUSCULAR | Status: DC | PRN

## 2018-07-19 MED ORDER — OXYTOCIN 40 UNITS IN LACTATED RINGERS INFUSION - SIMPLE MED
1.0000 m[IU]/min | INTRAVENOUS | Status: DC
  Administered 2018-07-19: 2 m[IU]/min via INTRAVENOUS

## 2018-07-19 MED ORDER — DIPHENHYDRAMINE HCL 50 MG/ML IJ SOLN: 12.5000 mg | INTRAMUSCULAR | Status: DC | PRN

## 2018-07-19 MED ORDER — BUPIVACAINE LIPOSOME 1.3 % IJ SUSP
INTRAMUSCULAR | Status: AC
  Filled 2018-07-19: qty 20

## 2018-07-19 MED ORDER — OXYCODONE HCL 5 MG PO TABS
10.0000 mg | ORAL_TABLET | ORAL | Status: DC | PRN
  Administered 2018-07-20 – 2018-07-21 (×5): 10 mg via ORAL
  Filled 2018-07-19 (×5): qty 2

## 2018-07-19 MED ORDER — OXYTOCIN 40 UNITS IN LACTATED RINGERS INFUSION - SIMPLE MED
INTRAVENOUS | Status: AC
  Filled 2018-07-19: qty 1000

## 2018-07-19 MED ORDER — BUPIVACAINE LIPOSOME 1.3 % IJ SUSP: 20.0000 mL | Freq: Once | INTRAMUSCULAR | Status: AC

## 2018-07-19 MED ORDER — ZOLPIDEM TARTRATE 5 MG PO TABS: 5.0000 mg | ORAL_TABLET | Freq: Every evening | ORAL | Status: DC | PRN

## 2018-07-19 MED ORDER — LACTATED RINGERS IV SOLN: INTRAVENOUS | Status: DC

## 2018-07-19 MED ORDER — PHENYLEPHRINE 40 MCG/ML (10ML) SYRINGE FOR IV PUSH (FOR BLOOD PRESSURE SUPPORT)
PREFILLED_SYRINGE | INTRAVENOUS | Status: DC | PRN
  Administered 2018-07-19 (×2): 50 ug via INTRAVENOUS
  Administered 2018-07-19: 100 ug via INTRAVENOUS

## 2018-07-19 MED ORDER — SIMETHICONE 80 MG PO CHEW: 80.0000 mg | CHEWABLE_TABLET | ORAL | Status: DC | PRN

## 2018-07-19 MED ORDER — SODIUM CHLORIDE 0.9 % IJ SOLN
INTRAMUSCULAR | Status: AC
  Filled 2018-07-19: qty 50

## 2018-07-19 MED ORDER — PRENATAL MULTIVITAMIN CH
1.0000 | ORAL_TABLET | Freq: Every day | ORAL | Status: DC
  Administered 2018-07-20 – 2018-07-22 (×3): 1 via ORAL
  Filled 2018-07-19 (×3): qty 1

## 2018-07-19 MED ORDER — ONDANSETRON HCL 4 MG/2ML IJ SOLN: 4.0000 mg | Freq: Once | INTRAMUSCULAR | Status: DC | PRN

## 2018-07-19 MED ORDER — SODIUM CHLORIDE 0.9 % IV SOLN
INTRAVENOUS | Status: DC | PRN
  Administered 2018-07-19: 30 ug/min via INTRAVENOUS

## 2018-07-19 MED ORDER — MORPHINE SULFATE (PF) 0.5 MG/ML IJ SOLN
INTRAMUSCULAR | Status: AC
  Filled 2018-07-19: qty 10

## 2018-07-19 MED ORDER — ONDANSETRON 4 MG PO TBDP: 4.0000 mg | ORAL_TABLET | Freq: Four times a day (QID) | ORAL | Status: DC | PRN

## 2018-07-19 MED ORDER — KETOROLAC TROMETHAMINE 30 MG/ML IJ SOLN
INTRAMUSCULAR | Status: AC
  Filled 2018-07-19: qty 1

## 2018-07-19 MED ORDER — KETOROLAC TROMETHAMINE 30 MG/ML IJ SOLN: 30.0000 mg | Freq: Four times a day (QID) | INTRAMUSCULAR | Status: DC | PRN

## 2018-07-19 MED ORDER — BUPIVACAINE LIPOSOME 1.3 % IJ SUSP
INTRAMUSCULAR | Status: DC | PRN
  Administered 2018-07-19: 50 mL

## 2018-07-19 MED ORDER — OXYCODONE HCL 5 MG PO TABS
5.0000 mg | ORAL_TABLET | ORAL | Status: DC | PRN
  Administered 2018-07-22 (×2): 5 mg via ORAL
  Filled 2018-07-19 (×3): qty 1

## 2018-07-19 MED ORDER — CEFAZOLIN SODIUM-DEXTROSE 2-4 GM/100ML-% IV SOLN
2.0000 g | Freq: Once | INTRAVENOUS | Status: AC
  Administered 2018-07-19: 2 g via INTRAVENOUS
  Filled 2018-07-19: qty 100

## 2018-07-19 MED ORDER — IBUPROFEN 600 MG PO TABS: 600.0000 mg | ORAL_TABLET | Freq: Four times a day (QID) | ORAL | Status: DC

## 2018-07-19 MED ORDER — ONDANSETRON HCL 4 MG/2ML IJ SOLN
INTRAMUSCULAR | Status: DC | PRN
  Administered 2018-07-19: 4 mg via INTRAVENOUS

## 2018-07-19 MED ORDER — ACETAMINOPHEN 325 MG PO TABS
650.0000 mg | ORAL_TABLET | ORAL | Status: DC | PRN
  Administered 2018-07-20 – 2018-07-22 (×5): 650 mg via ORAL
  Filled 2018-07-19 (×5): qty 2

## 2018-07-19 MED ORDER — IBUPROFEN 600 MG PO TABS
600.0000 mg | ORAL_TABLET | Freq: Four times a day (QID) | ORAL | Status: DC
  Administered 2018-07-20 – 2018-07-21 (×3): 600 mg via ORAL
  Filled 2018-07-19 (×4): qty 1

## 2018-07-19 SURGICAL SUPPLY — 27 items
BARRIER ADHS 3X4 INTERCEED (GAUZE/BANDAGES/DRESSINGS) ×3 IMPLANT
CANISTER SUCT 3000ML PPV (MISCELLANEOUS) ×3 IMPLANT
CHLORAPREP W/TINT 26ML (MISCELLANEOUS) ×3 IMPLANT
COVER WAND RF STERILE (DRAPES) ×3 IMPLANT
DRSG TELFA 3X8 NADH (GAUZE/BANDAGES/DRESSINGS) ×3 IMPLANT
ELECT CAUTERY BLADE 6.4 (BLADE) ×3 IMPLANT
ELECT REM PT RETURN 9FT ADLT (ELECTROSURGICAL) ×3
ELECTRODE REM PT RTRN 9FT ADLT (ELECTROSURGICAL) ×1 IMPLANT
GAUZE SPONGE 4X4 12PLY STRL (GAUZE/BANDAGES/DRESSINGS) ×3 IMPLANT
GLOVE BIO SURGEON STRL SZ8 (GLOVE) ×3 IMPLANT
GOWN STRL REUS W/ TWL LRG LVL3 (GOWN DISPOSABLE) ×2 IMPLANT
GOWN STRL REUS W/ TWL XL LVL3 (GOWN DISPOSABLE) ×1 IMPLANT
GOWN STRL REUS W/TWL LRG LVL3 (GOWN DISPOSABLE) ×4
GOWN STRL REUS W/TWL XL LVL3 (GOWN DISPOSABLE) ×2
NEEDLE HYPO 22GX1.5 SAFETY (NEEDLE) ×3 IMPLANT
NS IRRIG 1000ML POUR BTL (IV SOLUTION) ×3 IMPLANT
PACK C SECTION AR (MISCELLANEOUS) ×3 IMPLANT
PAD OB MATERNITY 4.3X12.25 (PERSONAL CARE ITEMS) ×3 IMPLANT
PAD PREP 24X41 OB/GYN DISP (PERSONAL CARE ITEMS) ×3 IMPLANT
STAPLER INSORB 30 2030 C-SECTI (MISCELLANEOUS) ×3 IMPLANT
STRAP SAFETY 5IN WIDE (MISCELLANEOUS) ×3 IMPLANT
SUCT VACUUM KIWI BELL (SUCTIONS) ×3 IMPLANT
SUT CHROMIC 1 CTX 36 (SUTURE) ×9 IMPLANT
SUT CHROMIC 2 0 CT 1 (SUTURE) ×3 IMPLANT
SUT PLAIN GUT 0 (SUTURE) ×6 IMPLANT
SUT VIC AB 0 CT1 36 (SUTURE) ×6 IMPLANT
SYR 30ML LL (SYRINGE) ×6 IMPLANT

## 2018-07-19 NOTE — Progress Notes (Signed)
[name] is a 37 y.o. G3P2002 at [redacted]w[redacted]d by  Objective: BP 125/74   Pulse 80   Temp 98.2 F (36.8 C) (Oral)   Resp 16   Ht 5\' 3"  (1.6 m)   Wt 83.5 kg   BMI 32.59 kg/m  I/O last 3 completed shifts: In: 986.8 [I.V.:986.8] Out: -  Total I/O In: 1631.9 [I.V.:1631.9] Out: -   FHT:  FHR: 150 bpm, variability: minimal ,  accelerations:  Abscent,  decelerations:  Present several late decles noted  UC:   irregular, every 5 minutes SVE:   Dilation: 4.5 Effacement (%): 60 Station: -3 Exam by:: Laural Benes RN  Labs: Lab Results  Component Value Date   WBC 11.1 (H) 07/18/2018   HGB 13.3 07/18/2018   HCT 38.8 07/18/2018   MCV 87.2 07/18/2018   PLT 195 07/18/2018    Assessment / Plan: Fetal intolerance to labor with recurrent late decels remote from delivery .  I recommend LTCS  And have fully explained the indication and the risk involved to the pt .  Translator on the internet and all questions have been answered . Consent signed. Pt declined BTL .   [name] 07/19/2018, 12:51 PM

## 2018-07-19 NOTE — Discharge Summary (Signed)
Obstetrical Discharge Summary  Patient Name: [name] DOB: July 29, 1981 MRN: [id number]  Date of Admission: 07/18/2018 Date of Delivery:07/19/18 Delivered by: Tagan Bartram Date of Discharge: 07/22/18 Primary OB: [facility] LMP:No LMP recorded. EDC Estimated Date of Delivery: 07/10/18 Gestational Age at Delivery: [redacted]w[redacted]d   Antepartum complications: fetal intolerance to labor  Admitting Diagnosis: fetal decels at [facility]   Secondary Diagnosis: Patient Active Problem List   Diagnosis Date Noted  . Post-operative state 07/19/2018  . Term pregnancy 07/18/2018  . Advanced maternal age in multigravida, first trimester     Augmentation: Complications: None Intrapartum complications/course:  Date of Delivery:  Delivered WU:[id number] MD Delivery Type:LTCS  Anesthesia:Spinal Placenta:manual Laceration:  Episiotomy: none Newborn Data: This patient has no babies on file.   Postpartum Procedures:   Post partum course: Patient had an uncomplicated postpartum course.  By time of discharge on PPD#3, her pain was controlled on oral pain medications; she had appropriate lochia and was ambulating, voiding without difficulty and tolerating regular diet.  She was deemed stable for discharge to home.    (Cesarean Section):  Patient had an uncomplicated postpartum course.  By time of discharge on POD#3, her pain was controlled on oral pain medications; she had appropriate lochia and was ambulating, voiding without difficulty, tolerating regular diet and passing flatus.   She was deemed stable for discharge to home.    Discharge Physical Exam: BP 125/72 (BP Location: Right Arm)   Pulse 90   Temp 98.2 F (36.8 C) (Oral)   Resp 12   Ht 5\' 3"  (1.6 m)   Wt 83.5 kg   SpO2 96%   Breastfeeding? Unknown   BMI 32.59 kg/m   General: NAD CV: RRR Pulm: CTABL, nl effort ABD: s/nd/nt, fundus firm and below the umbilicus Lochia: moderate Incision: c/d/i DVT Evaluation: LE non-ttp, no evidence of  DVT on exam.  Hemoglobin  Date Value Ref Range Status  07/20/2018 11.2 (L) 12.0 - 15.0 g/dL Final   HCT  Date Value Ref Range Status  07/20/2018 32.9 (L) 36.0 - 46.0 % Final     Disposition: stable, discharge to home. Baby Feeding: breastmilk Baby Disposition: home with mom  Rh Immune globulin given: Rubella vaccine given:  Tdap vaccine given in AP or PP setting:  Flu vaccine given in AP or PP setting:   Contraception:   Prenatal Labs:     Plan:  [name] was discharged to home in good condition. Follow-up appointment with delivering provider in 6 weeks.  Discharge Medications: Allergies as of 07/22/2018   No Known Allergies     Medication List    TAKE these medications   multivitamin-prenatal 27-0.8 MG Tabs tablet Take 1 tablet by mouth daily at 12 noon.   oxyCODONE 5 MG immediate release tablet Commonly known as:  Oxy IR/ROXICODONE Take 1 tablet (5 mg total) by mouth every 4 (four) hours as needed for up to 7 days (pain scale 4-7).       Follow-up Information    Izaah Westman, Ihor Austin, MD. Go on 08/02/2018.   Specialty:  Obstetrics and Gynecology Why:  2:00pm for incision check Contact information: [address] [place] [address] [phone]           Signed:  Hit refresh and delete this line

## 2018-07-19 NOTE — Transfer of Care (Signed)
Immediate Anesthesia Transfer of Care Note  Patient: Geronimo Running  Procedure(s) Performed: CESAREAN SECTION- URGENT (N/A )  Patient Location: PACU  Anesthesia Type:Spinal  Level of Consciousness: awake, alert  and oriented  Airway & Oxygen Therapy: Patient Spontanous Breathing  Post-op Assessment: Report given to RN and Post -op Vital signs reviewed and stable  Post vital signs: Reviewed and stable  Last Vitals:  Vitals Value Taken Time  BP 99/63 07/19/2018  2:21 PM  Temp 36 C 07/19/2018  2:21 PM  Pulse 67 07/19/2018  2:21 PM  Resp 10 07/19/2018  2:21 PM  SpO2      Last Pain:  Vitals:   07/19/18 1421  TempSrc: Oral  PainSc:          Complications: No apparent anesthesia complications

## 2018-07-19 NOTE — Progress Notes (Signed)
[name] is a 37 y.o. G3P2002 at [redacted]w[redacted]d with induction of labor for non reassuring fetal testing, having periods of min var with late decels, followed by periods of mod var with accels  Subjective: Feeling contractions strongly  Objective: BP 107/77   Pulse 75   Temp 98.3 F (36.8 C) (Oral)   Resp 16   Ht 5\' 3"  (1.6 m)   Wt 83.5 kg   BMI 32.59 kg/m  I/O last 3 completed shifts: In: 986.8 [I.V.:986.8] Out: -  No intake/output data recorded.  FHT:  FHR: 130 bpm, variability: min to mod,  accelerations:  Present,  decelerations:  Present late UC:   irregular, every 4 minutes SVE:   Dilation: 1 Effacement (%): 40 Station: -3 Exam by:: Dr. Dalbert Garnet  Labs: Lab Results  Component Value Date   WBC 11.1 (H) 07/18/2018   HGB 13.3 07/18/2018   HCT 38.8 07/18/2018   MCV 87.2 07/18/2018   PLT 195 07/18/2018    Assessment / Plan: Non reassuring FHT intermittently, with periods of reassuring accels and moderate variability. The baby is not acidemic while accels are present, and therefore I feel comfortable moving forward with induction.   Cervical foley cath placed now, which patient tolerated well. My goal would be to get her to a place to safely AROM, which will hopefully hurry labor along. We have started pitocin, and frequently reposition mom with oxygen applied as needed.  Continuous fetal monitoring  Christeen Douglas 07/19/2018, 7:30 AM

## 2018-07-19 NOTE — Op Note (Signed)
[name], [name] MEDICAL RECORD [id number] ACCOUNT [id number] DATE OF BIRTH:08/10/1981 FACILITY: [facility] LOCATION: [facility] PHYSICIAN:THOMAS Cloyde Reams, MD  OPERATIVE REPORT  DATE OF PROCEDURE:  07/19/2018  PREOPERATIVE DIAGNOSES:   1.  Postdates gestation 41+1 weeks. 2.  Fetal intolerance to labor.  POSTOPERATIVE DIAGNOSES:   1.  Postdates gestation. 2.  Fetal intolerance to labor. 3.  Cephalopelvic disproportion.  PROCEDURE:  Primary low transverse cesarean section.  ANESTHESIA:  Spinal.  SURGEON:  Jennell Corner, MD.  FIRST ASSISTANT:  Brame, scrub tech.  PREAMBLE:  A 37 year old gravida 3, para 2 patient was admitted from [facility] with nonreassuring fetal monitoring.  The patient was admitted to labor and delivery and induction was started.  The patient throughout the next 16  hours had intermittent late decelerations which responded to in utero resuscitation.  Ultimately, the patient had several back to back late decelerations nonresponsive to intrauterine resuscitation.  The patient's cervix was only 4.5 cm dilated at this  time.  DESCRIPTION OF PROCEDURE:  After adequate spinal anesthesia, the patient was placed in dorsal supine position, hip roll on the right side.  The patient did receive 2 grams IV Ancef for surgical prophylaxis.  The patient was prepped and draped in normal  sterile fashion.  Timeout was performed.  A Pfannenstiel incision was made 2 fingerbreadths above the symphysis pubis.  Sharp dissection was used to identify the fascia.  Fascia was opened in the midline and opened in a transverse fashion.  The superior  aspect of the fascia was grasped with Kocher clamps and the recti muscles were dissected free.  Inferior aspect of the fascia was grasped with Kocher clamps and the pyramidalis muscle was dissected free.  The peritoneal cavity was opened sharply.  The  vesicouterine peritoneal fold was identified and  opened and the bladder was reflected inferiorly.  Low transverse uterine incision was made.  Upon entry into the endometrial cavity, clear fluid resulted.  The incision was extended with blunt transverse  traction.  A wedged large fetal head was pushed up against the symphysis pubis consistent with cephalopelvic disproportion.  The head was manipulated and the vacuum was applied to the occiput and with one gentle push and pull the head was delivered.   Vacuum was removed.  Shoulders were delivered as well as the body and the legs.  The baby was dried on the mother's abdomen and delayed cord clamp occurred for 60 seconds.  Time of birth 13:34 on 07/19/2018.  After 60 seconds, the cord was clamped and a  vigorous female was passed to the nursery staff who assigned Apgar scores of 9 and 9.  Fetal weight 8 pounds 1 ounce.  Placenta was manually removed and the uterus was then exteriorized.  The endometrial cavity was wiped clean with laparotomy tape.  Good  hemostasis was noted.  Uterine incision was closed with #1 chromic suture in a running locking fashion.  Two additional figure-of-eight sutures were required for good hemostasis.  Fallopian tubes and ovaries appeared normal.  Posterior cul-de-sac was  irrigated and suctioned.  Uterus was placed back into the abdominal cavity and the pericolic gutters were wiped clean with laparotomy tape.  Uterine incision again appeared hemostatic.  Interceed was placed over the uterine incision in a T-shaped  fashion.  The fascia was then closed with 0 Vicryl suture in a running nonlocking fashion.  Good approximation of tissues.  Additional figure-of-eight suture was placed in the midline for a small amount of oozing.  Fascia was then injected with a  solution of 20 mL of 1.3% Exparel plus 20 mL of 0.5% Marcaine plus 50 mL normal saline, 60 mL was used to inject the fascial planes.  Subcutaneous tissues were irrigated and bovied for hemostasis.  Given the approximate  subcutaneous space, depth of 3.5  cm, this dead space was closed with a running 2-0 chromic suture.  Skin was reapproximated with Insorb absorbable staples.  Good cosmetic effect and the additional 25 mL of Exparel solution was injected at the skin level.  COMPLICATIONS:  There were no complications.  ESTIMATED BLOOD LOSS:  500 mL  INTRAOPERATIVE FLUIDS:  600 mL  The patient tolerated the procedure well and was taken to recovery room in good condition.  TN/NUANCE  D:07/19/2018 T:07/19/2018 JOB:[id number]

## 2018-07-19 NOTE — Brief Op Note (Signed)
07/19/2018  2:02 PM  PATIENT:  [name]  37 y.o. female  PRE-OPERATIVE DIAGNOSIS:  fetal intolerance to labor , recurrent late decels  POST-OPERATIVE DIAGNOSIS:  fetal intolerance to labor . CPD  PROCEDURE:  Procedure(s): CESAREAN SECTION- URGENT (N/A) LTCS  SURGEON:  Surgeon(s) and Role:    * Schermerhorn, Ihor Austin, MD - Primary  PHYSICIAN ASSISTANT: Brame , scrub tech   ASSISTANTS: none   ANESTHESIA:   spinal  EBL:  500 mL , IOF 600 cc  BLOOD ADMINISTERED:none  DRAINS: Urinary Catheter (Foley)   LOCAL MEDICATIONS USED:  MARCAINE   , BUPIVICAINE , Amount: 85ml and OTHERincluded with NS SPECIMEN:  No Specimen  DISPOSITION OF SPECIMEN:  N/A  COUNTS:  YES  TOURNIQUET:  * No tourniquets in log *  DICTATION: .Other Dictation: Dictation Number verbal  PLAN OF CARE: Admit to inpatient   PATIENT DISPOSITION:  PACU - hemodynamically stable.   Delay start of Pharmacological VTE agent (>24hrs) due to surgical blood loss or risk of bleeding: not applicable

## 2018-07-19 NOTE — Progress Notes (Signed)
[name] is a 37 y.o. G3P2002 at [redacted]w[redacted]d foley cath in place . Intermittent late decelerations with in utero resuscitation. Pitocin off  Subjective: No c/o   Objective: BP 106/72 (BP Location: Right Arm)   Pulse 80   Temp 97.9 F (36.6 C) (Oral)   Resp 16   Ht 5\' 3"  (1.6 m)   Wt 83.5 kg   BMI 32.59 kg/m  I/O last 3 completed shifts: In: 986.8 [I.V.:986.8] Out: -  Total I/O In: 1359.5 [I.V.:1359.5] Out: -   FHT:  FHR: 140 bpm, variability: moderate,  accelerations:  Abscent,  decelerations:  Present intermittent late decels UC:  q 3-5  SVE:   3 cm/ 50%/-3 AROM  Blood tinged  FSE and IUPC Labs: Lab Results  Component Value Date   WBC 11.1 (H) 07/18/2018   HGB 13.3 07/18/2018   HCT 38.8 07/18/2018   MCV 87.2 07/18/2018   PLT 195 07/18/2018    Assessment / Plan: Non reassuring fetal monitoring with late declerations .  Will observe for the next 30 minutes and if strip does not improve she is aware of the possibility of LTCS . Copy used .  Ihor Austin Gaelle Adriance 07/19/2018, 8:29 AM

## 2018-07-19 NOTE — Progress Notes (Signed)
Patient ID: [name], female   DOB: 25-Sep-1981, 37 y.o.   MRN: [id number] Fetal monitored has improved   + accels and mod variability .  Continue to monitor

## 2018-07-19 NOTE — Anesthesia Procedure Notes (Signed)
Performed by: Bensen Chadderdon, CRNA Pre-anesthesia Checklist: Patient identified, Emergency Drugs available, Suction available, Patient being monitored and Timeout performed Oxygen Delivery Method: Nasal cannula       

## 2018-07-19 NOTE — Anesthesia Post-op Follow-up Note (Signed)
Anesthesia QCDR form completed.        

## 2018-07-19 NOTE — Progress Notes (Signed)
Patient's strip reviewed. Periods of repeative late decels followed by minimal variability, resolved with conservative measures. Pt is still having accelerations intermittently. Will continue to try for vaginal delivery, but will monitor continuously and if Cat II persists without reassuring features with accels, will move toward expedited delivery. Discussed with patient and husband at time of admission

## 2018-07-19 NOTE — Anesthesia Preprocedure Evaluation (Signed)
Anesthesia Evaluation  Patient identified by MRN, date of birth, ID band Patient awake    Reviewed: Allergy & Precautions, NPO status , Patient's Chart, lab work & pertinent test results  History of Anesthesia Complications Negative for: history of anesthetic complications  Airway Mallampati: II       Dental   Pulmonary neg sleep apnea, neg COPD,           Cardiovascular (-) hypertension(-) Past MI and (-) CHF (-) dysrhythmias (-) Valvular Problems/Murmurs     Neuro/Psych neg Seizures    GI/Hepatic Neg liver ROS, GERD (with pregnancy)  ,  Endo/Other  neg diabetes  Renal/GU negative Renal ROS     Musculoskeletal   Abdominal   Peds  Hematology   Anesthesia Other Findings   Reproductive/Obstetrics                             Anesthesia Physical Anesthesia Plan  ASA: II and emergent  Anesthesia Plan: Spinal   Post-op Pain Management:    Induction:   PONV Risk Score and Plan:   Airway Management Planned:   Additional Equipment:   Intra-op Plan:   Post-operative Plan:   Informed Consent: I have reviewed the patients History and Physical, chart, labs and discussed the procedure including the risks, benefits and alternatives for the proposed anesthesia with the patient or authorized representative who has indicated his/her understanding and acceptance.     Plan Discussed with:   Anesthesia Plan Comments:         Anesthesia Quick Evaluation

## 2018-07-20 ENCOUNTER — Encounter: Payer: Self-pay | Admitting: Obstetrics and Gynecology

## 2018-07-20 LAB — CBC
HCT: 32.9 % — ABNORMAL LOW (ref 36.0–46.0)
HEMOGLOBIN: 11.2 g/dL — AB (ref 12.0–15.0)
MCH: 29.9 pg (ref 26.0–34.0)
MCHC: 34 g/dL (ref 30.0–36.0)
MCV: 88 fL (ref 80.0–100.0)
Platelets: 179 10*3/uL (ref 150–400)
RBC: 3.74 MIL/uL — ABNORMAL LOW (ref 3.87–5.11)
RDW: 14.6 % (ref 11.5–15.5)
WBC: 16.8 10*3/uL — ABNORMAL HIGH (ref 4.0–10.5)
nRBC: 0 % (ref 0.0–0.2)

## 2018-07-20 LAB — RPR: RPR: NONREACTIVE

## 2018-07-20 NOTE — Plan of Care (Signed)
Vs stable; moves well in bed; taking tylenol and toradol; breastfeeding and does need some assistance

## 2018-07-20 NOTE — Anesthesia Postprocedure Evaluation (Cosign Needed)
Anesthesia Post Note  Patient: [name]  Procedure(s) Performed: CESAREAN SECTION- URGENT (N/A )  Patient location during evaluation: [facility] Anesthesia Type: Spinal Level of consciousness: awake, awake and alert, oriented and patient cooperative Pain management: pain level controlled Vital Signs Assessment: post-procedure vital signs reviewed and stable Respiratory status: spontaneous breathing, nonlabored ventilation and respiratory function stable Cardiovascular status: stable Postop Assessment: no headache, no backache, patient able to bend at knees, no apparent nausea or vomiting, adequate PO intake and able to ambulate Anesthetic complications: no     Last Vitals:  Vitals:   07/20/18 0338 07/20/18 0741  BP: 101/65 102/72  Pulse: 92 80  Resp: 18 18  Temp: 37.2 C 36.6 C  SpO2: 98% 98%    Last Pain:  Vitals:   07/20/18 0900  TempSrc:   PainSc: 4                  Kiet Geer,  Rahma Meller R

## 2018-07-20 NOTE — Progress Notes (Signed)
Subjective: Postpartum Day 1: Cesarean Delivery Patient reports mild pain   Objective: Vital signs in last 24 hours: Temp:  [96.8 F (36 C)-99 F (37.2 C)] 97.8 F (36.6 C) (10/23 0741) Pulse Rate:  [67-96] 80 (10/23 0741) Resp:  [10-19] 18 (10/23 0741) BP: (98-125)/(56-82) 102/72 (10/23 0741) SpO2:  [95 %-99 %] 98 % (10/23 0741)  Physical Exam:  General: alert and cooperative Lochia: appropriate Uterine Fundus: firm Incision: no significant drainage DVT Evaluation: No evidence of DVT seen on physical exam.  Recent Labs    07/18/18 1841 07/20/18 0500  HGB 13.3 11.2*  HCT 38.8 32.9*    Assessment/Plan: Status post Cesarean section. Doing well postoperatively.  Continue current care. D/c iv / probable d/c tomorrow Ihor Austin Offie Waide 07/20/2018, 9:10 AM

## 2018-07-21 MED ORDER — IBUPROFEN 600 MG PO TABS
600.0000 mg | ORAL_TABLET | Freq: Four times a day (QID) | ORAL | Status: DC
  Administered 2018-07-21 – 2018-07-22 (×5): 600 mg via ORAL
  Filled 2018-07-21 (×6): qty 1

## 2018-07-21 NOTE — Plan of Care (Signed)
Vs stable; up ad lib; tolerating regular diet; taking motrin, tylenol and roxicodone for pain control; breastfeeding well; original dressing still in place per Dr. Feliberto Gottron pt may shower on 07-21-18

## 2018-07-21 NOTE — Progress Notes (Signed)
Post Op Day 2  Subjective: Doing well, no concerns. Ambulating without difficulty, pain managed with PO meds, tolerating regular diet, and voiding without difficulty.   No fever/chills, chest pain, shortness of breath, nausea/vomiting, or leg pain. No nipple or breast pain.   Objective: BP 102/74 (BP Location: Right Arm)   Pulse 86   Temp 98.3 F (36.8 C) (Oral)   Resp 20   Ht 5\' 3"  (1.6 m)   Wt 83.5 kg   SpO2 96%   Breastfeeding? Unknown   BMI 32.59 kg/m    Physical Exam:  General: alert, cooperative, appears stated age and no distress Breasts: soft/nontender CV: RRR Pulm: nl effort, CTABL Abdomen: soft, non-tender, active bowel sounds Uterine Fundus: firm Incision: healing well, no significant drainage, no dehiscence, no significant erythema Lochia: appropriate DVT Evaluation: No evidence of DVT seen on physical exam. No cords or calf tenderness. No significant calf/ankle edema.  Recent Labs    07/18/18 1841 07/20/18 0500  HGB 13.3 11.2*  HCT 38.8 32.9*  WBC 11.1* 16.8*  PLT 195 179    Assessment/Plan: 37 y.o. G3P3003 postop day # 2  -Continue routine postpartum care. -Lactation consult PRN for breastfeeding. -Immunization status: all immunizations up to date.  -Baby being kept for bilirubin levels, on the lights. -Plan for discharge home tomorrow.   Disposition: Continue inpatient postpartum care.   LOS: 3 days   Genia Del, CNM 07/21/2018, 10:17 AM   ----- Genia Del Certified Nurse Midwife [facility]

## 2018-07-21 NOTE — Lactation Note (Signed)
This note was copied from a baby's chart. Baby nursing on right side when Instructor/LC entered room. Semi-sidelying with baby close to edge of bed. Mother repositioned and pillows added for support and safety of newborn. Infant s afety reviewed with mother.

## 2018-07-22 MED ORDER — OXYCODONE HCL 5 MG PO TABS: 5.0000 mg | ORAL_TABLET | ORAL | 0 refills | Status: AC | PRN

## 2018-07-22 NOTE — Progress Notes (Signed)
Provided and reviewed discharge paperwork and prescriptions. Utilized [employer] interpreter Jesus [phone]. Pt verbalized understanding, utilized teach back method as well. Follow up appointment provided. Next timing/dosage of medication reviewed, prescribed medications already at bedside brought by pt's spouse. Patient to continue to room in with infant who has not discharged due to need of bililights.

## 2018-07-22 NOTE — Plan of Care (Signed)
Patient's vital signs stable; fundus firm; small amount rubra lochia; voiding; good appetite; good po fluids; pain controlled with po motrin; breast and bottle (supplemental per pediatrician order) feeding infant with good technique observed; husband at bedside and attentive; patient ambulating independently in room; patient and her husband watched [employer] DVD; copy given.

## 2018-07-22 NOTE — Discharge Summary (Addendum)
Obstetrical Discharge Summary  Patient Name: [name] DOB: 19-Apr-1981 MRN: [id number]  Date of Admission: 07/18/2018 Date of Delivery:07/19/18 Delivered by: Schermerhorn Date of Discharge: 07/22/2018  Primary OB: [facility] LMP:No LMP recorded. EDC Estimated Date of Delivery: 07/10/18 Gestational Age at Delivery: [redacted]w[redacted]d   Antepartum complications: fetal intolerance to labor  Admitting Diagnosis: fetal decels at [facility]   Secondary Diagnosis: Patient Active Problem List   Diagnosis Date Noted  . Post-operative state 07/19/2018  . Term pregnancy 07/18/2018    Augmentation:Cytotec Complications: Late decelerations, CPD Intrapartum complications/course: non-reassuring FHR, CPD Date of Delivery: 07/19/18 Delivered WU:[id number] MD Delivery Type:LTCS  Anesthesia:Spinal Placenta:manual Laceration: N/A Episiotomy: none Newborn Data:    Postpartum Procedures: N/A  Post partum course: Patient had an uncomplicated postpartum course.  By time of discharge on PPD#3, her pain was controlled on oral pain medications; she had appropriate lochia and was ambulating, voiding without difficulty and tolerating regular diet.  She was deemed stable for discharge to home.    (Cesarean Section):  Patient had an uncomplicated postpartum course.  By time of discharge on POD#3, her pain was controlled on oral pain medications; she had appropriate lochia and was ambulating, voiding without difficulty, tolerating regular diet and passing flatus.   She was deemed stable for discharge to home.    Discharge Physical Exam: BP 103/72 (BP Location: Right Arm)   Pulse 83   Temp 97.8 F (36.6 C) (Oral)   Resp 18   Ht 5\' 3"  (1.6 m)   Wt 83.5 kg   SpO2 97%   Breastfeeding? Unknown   BMI 32.59 kg/m   General: NAD CV: RRR Pulm: CTABL, nl effort ABD: s/nd/nt, fundus firm and below the umbilicus Lochia: moderate Incision: c/d/i DVT Evaluation: LE non-ttp, no evidence of DVT on exam.  Hemoglobin   Date Value Ref Range Status  07/20/2018 11.2 (L) 12.0 - 15.0 g/dL Final   HCT  Date Value Ref Range Status  07/20/2018 32.9 (L) 36.0 - 46.0 % Final     Disposition: stable, discharge to home. Baby Feeding: breastmilk Baby Disposition: home with mom  Rh Immune globulin given: N/A Rubella vaccine given: Immune Tdap vaccine given in AP or PP setting:  Flu vaccine given in AP or PP setting:   Contraception: Condoms  Prenatal Labs:   Prenatal Labs: Blood type/Rh  Rh positive  Antibody screen neg  Rubella Immune  Varicella Immune  RPR NR  HBsAg Neg  HIV NR  GC neg  Chlamydia neg  Genetic screening negative  1 hour GTT Unknown but not noted positive in records  3 hour GTT n/a  GBS neg   ImagingResults  No results found.    Assessment: 1. LTCS for fetal decels and CPD 2. POD#3 stable         Electronically signed by Christeen Douglas, MD at 07/18/2018 6:31 PM      Plan:  Geronimo Running was discharged to home in good condition. Follow-up appointment with delivering provider in 2 weeks.  Discharge Medications: PNV, Fe, Oxycodone Rx  Tdap and flu UTD  Signed: Myrtie Cruise, MSN, CNM, FNP Certified Nurse Midwife [facility]

## 2018-07-22 NOTE — Lactation Note (Addendum)
This note was copied from a baby's chart. Lactation Consultation Note  Patient Name: [name] Today's Date: 07/22/2018     Maternal Data    Feeding Feeding Type: Breast Fed Nipple Type: Slow - flow  LATCH Score                   Interventions    Lactation Tools Discussed/Used     Consult Status  LC talked with mother about feeding plan and plan for breastfeeding. Mother states that infant is not breastfeeding well since supplementation with the bottle. LC provided mother with a pump kit and encouraged her to pump every 2-3 hours to establish her milk supply. LC reviewed set up and use of the pump and how to use according to manufacturer's instructions. LC encouraged mother to continue with plan of supplementing with pumped breast milk or formula as recommended by Pediatric provider.    Alicia Lilly 07/22/2018, 2:21 PM    

## 2018-07-22 NOTE — Discharge Instructions (Signed)
Cesarean Delivery Cesarean birth, or cesarean delivery, is the surgical delivery of a baby through an incision in the abdomen and the uterus. This may be referred to as a C-section. This procedure may be scheduled ahead of time, or it may be done in an emergency situation. Tell a health care provider about:  Any allergies you have.  All medicines you are taking, including vitamins, herbs, eye drops, creams, and over-the-counter medicines.  Any problems you or family members have had with anesthetic medicines.  Any blood disorders you have.  Any surgeries you have had.  Any medical conditions you have.  Whether you or any members of your family have a history of deep vein thrombosis (DVT) or pulmonary embolism (PE). What are the risks? Generally, this is a safe procedure. However, problems may occur, including:  Infection.  Bleeding.  Allergic reactions to medicines.  Damage to other structures or organs.  Blood clots.  Injury to your baby.  What happens before the procedure?  Follow instructions from your health care provider about eating or drinking restrictions.  Follow instructions from your health care provider about bathing before your procedure to help reduce your risk of infection.  If you know that you are going to have a cesarean delivery, do not shave your pubic area. Shaving before the procedure may increase your risk of infection.  Ask your health care provider about: ? Changing or stopping your regular medicines. This is especially important if you are taking diabetes medicines or blood thinners. ? Your pain management plan. This is especially important if you plan to breastfeed your baby. ? How long you will be in the hospital after the procedure. ? Any concerns you may have about receiving blood products if you need them during the procedure. ? Cord blood banking, if you plan to collect your baby's umbilical cord blood.  You may also want to ask your  health care provider: ? Whether you will be able to hold or breastfeed your baby while you are still in the operating room. ? Whether your baby can stay with you immediately after the procedure and during your recovery. ? Whether a family member or a person of your choice can go with you into the operating room and stay with you during the procedure, immediately after the procedure, and during your recovery.  Plan to have someone drive you home when you are discharged from the hospital. What happens during the procedure?  Fetal monitors will be placed on your abdomen to monitor your heart rate and your baby's heart rate.  Depending on the reason for your cesarean delivery, you may have a physical exam or additional testing, such as an ultrasound.  An IV tube will be inserted into one of your veins.  You may have your blood or urine tested.  You will be given antibiotic medicine to help prevent infection.  You may be given a special warming gown to wear to keep your temperature stable.  Hair may be removed from your pubic area.  The skin of your pubic area and lower abdomen will be cleaned with a germ-killing solution (antiseptic).  A catheter may be inserted into your bladder through your urethra. This drains your urine during the procedure.  You may be given one or more of the following: ? A medicine to numb the area (local anesthetic). ? A medicine to make you fall asleep (general anesthetic). ? A medicine (regional anesthetic) that is injected into your back or through a small   thin tube placed in your back (spinal anesthetic or epidural anesthetic). This numbs everything below the injection site and allows you to stay awake during your procedure. If this makes you feel nauseous, tell your health care provider. Medicines will be available to help reduce any nausea you may feel.  An incision will be made in your abdomen, and then in your uterus.  If you are awake during your  procedure, you may feel tugging and pulling in your abdomen, but you should not feel pain. If you feel pain, tell your health care provider immediately.  Your baby will be removed from your uterus. You may feel more pressure or pushing while this happens.  Immediately after birth, your baby will be dried and kept warm. You may be able to hold and breastfeed your baby. The umbilical cord may be clamped and cut during this time.  Your placenta will be removed from your uterus.  Your incisions will be closed with stitches (sutures). Staples, skin glue, or adhesive strips may also be applied to the incision in your abdomen.  Bandages (dressings) will be placed over the incision in your abdomen. The procedure may vary among health care providers and hospitals. What happens after the procedure?  Your blood pressure, heart rate, breathing rate, and blood oxygen level will be monitored often until the medicines you were given have worn off.  You may continue to receive fluids and medicines through an IV tube.  You will have some pain. Medicines will be available to help control your pain.  To help prevent blood clots: ? You may be given medicines. ? You may have to wear compression stockings or devices. ? You will be encouraged to walk around when you are able.  Hospital staff will encourage and support bonding with your baby. Your hospital may allow you and your baby to stay in the same room (rooming in) during your hospital stay to encourage successful breastfeeding.  You may be encouraged to cough and breathe deeply often. This helps to prevent lung problems.  If you have a catheter draining your urine, it will be removed as soon as possible after your procedure. This information is not intended to replace advice given to you by your health care provider. Make sure you discuss any questions you have with your health care provider. Document Released: 09/14/2005 Document Revised: 02/20/2016  Document Reviewed: 06/25/2015 Elsevier Interactive Patient Education  2018 [employer]vier Inc.  

## 2018-07-26 ENCOUNTER — Encounter (HOSPITAL_COMMUNITY): Payer: Self-pay

## 2018-07-26 ENCOUNTER — Encounter: Payer: Self-pay | Admitting: Emergency Medicine

## 2018-10-05 ENCOUNTER — Encounter (HOSPITAL_COMMUNITY): Payer: Self-pay

## 2019-05-30 IMAGING — US US MFM OB COMPLETE +14 WKS
1 series · 13 of 28 positions shown · non-contrast
Comparison: none

PATIENT INFO:

PERFORMED BY:
GONGADZE PA
SERVICE(S) PROVIDED:
INDICATIONS:
Weeks of gestation of pregnancy not
specified
FETAL EVALUATION:
Num Of Fetuses:     1
Fetal Heart         145
Rate(bpm):
Cardiac Activity:   Present
Presentation:       Transverse
Placenta:           Anterior
BIOMETRY:
CRL:      74.5  mm     G. Age:  13w 4d                  EDD:   07/10/18
ANATOMY:
Choroid Plexus:        Within Normal Limits   Spine:                  Normal appearance
Stomach:               Seen                   Upper Extremities:      Visualized
Abdominal Wall:        Within Normal Limits   Lower Extremities:      Visualized
Bladder:               Seen

[Series 1: us mfm ob complete +14 wks · 0.25mm/px · 13 of 32 slices shown]
[im 2/32]
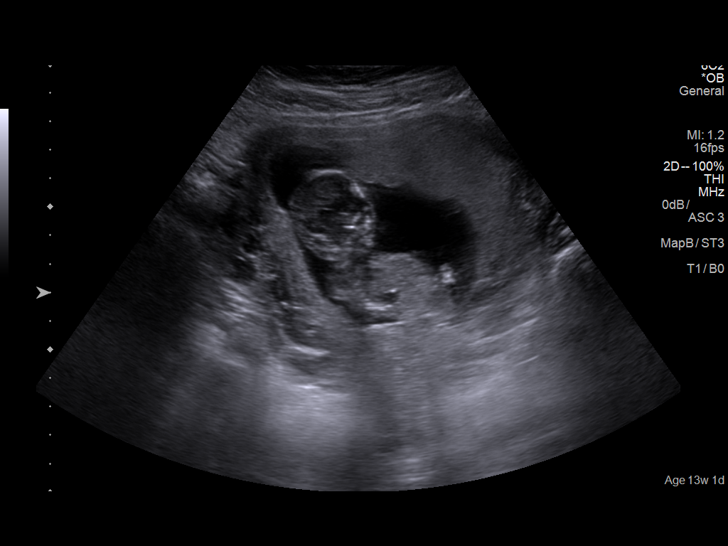
[im 4/32]
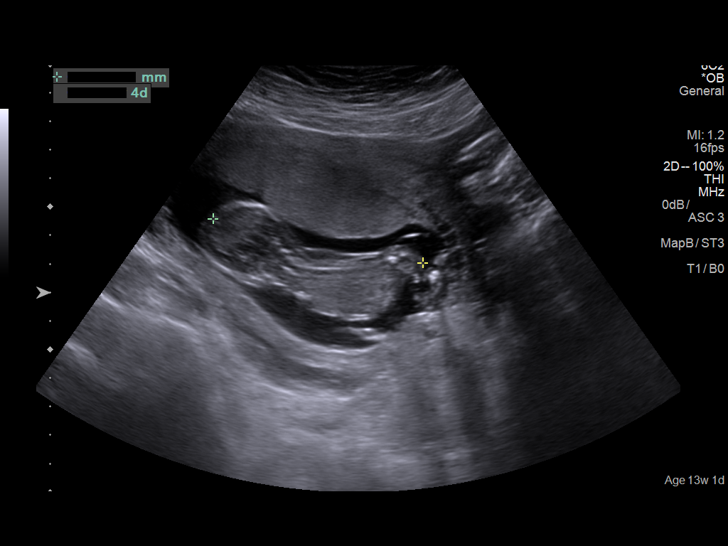
[im 6/32]
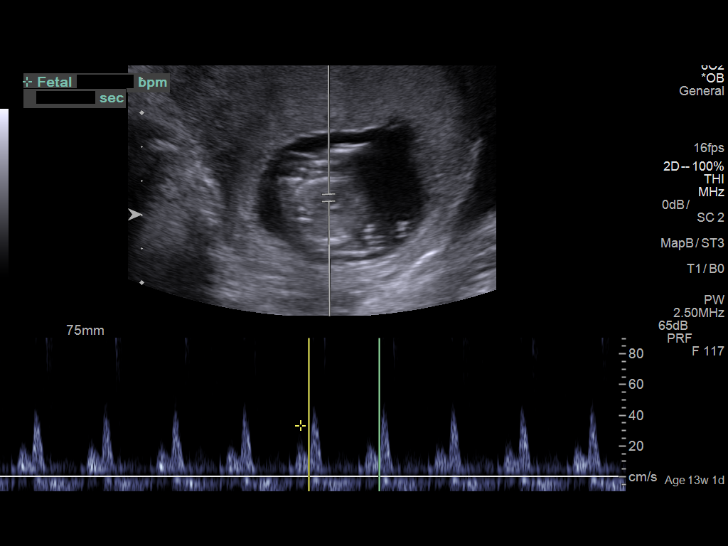
[im 9/32]
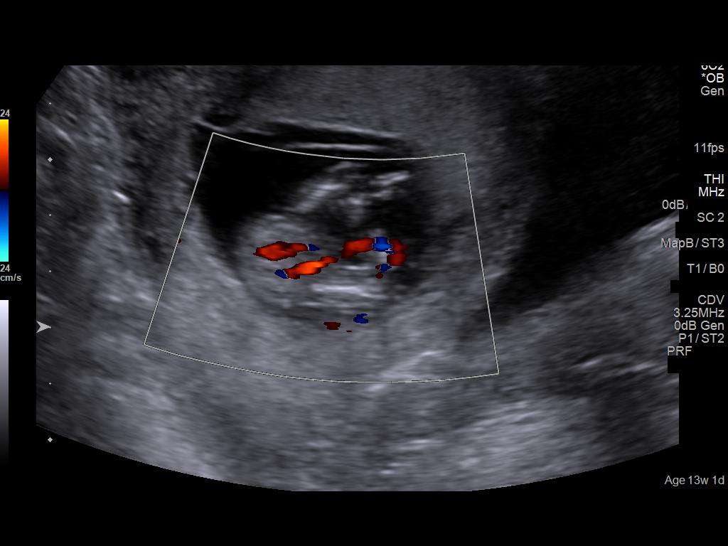
[im 11/32]
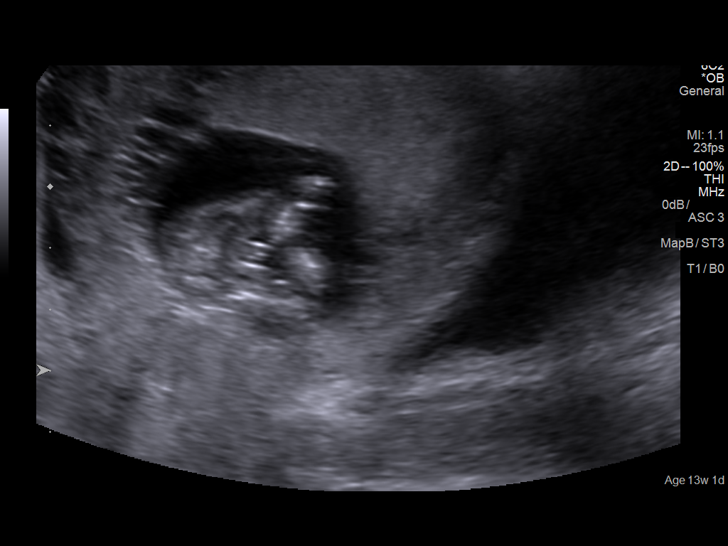
[im 13/32]
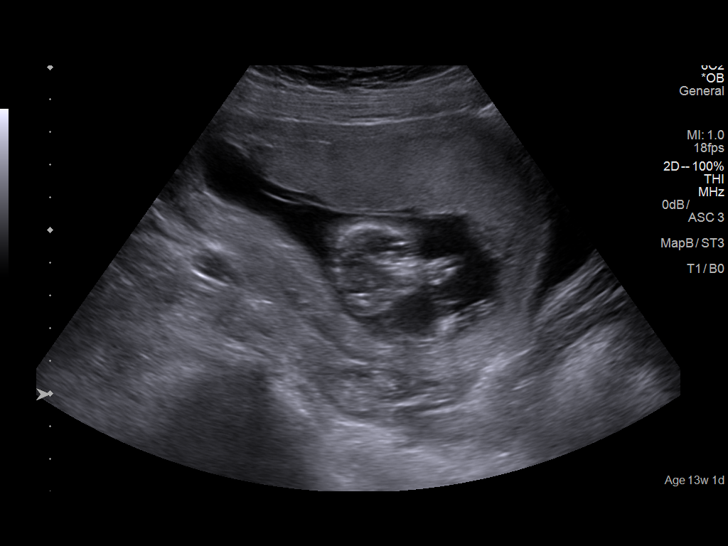
[im 17/32]
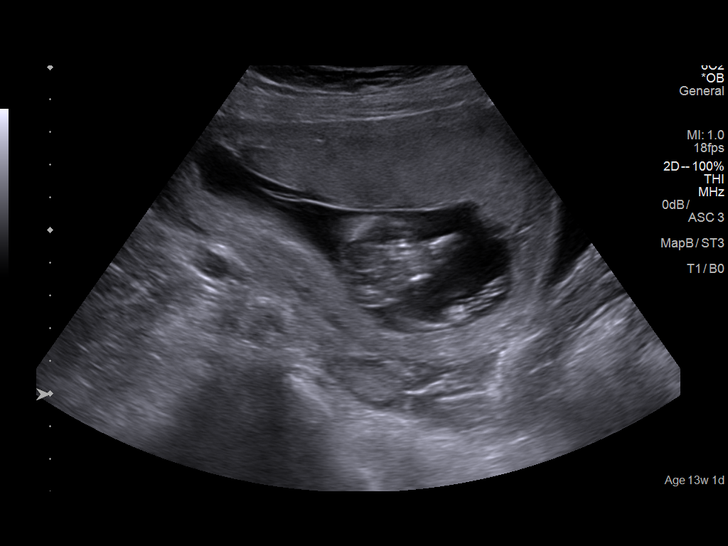
[im 19/32]
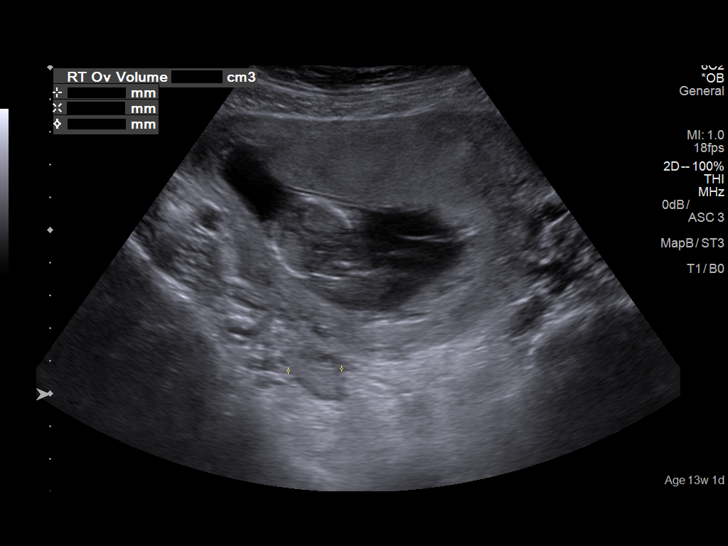
[im 21/32]
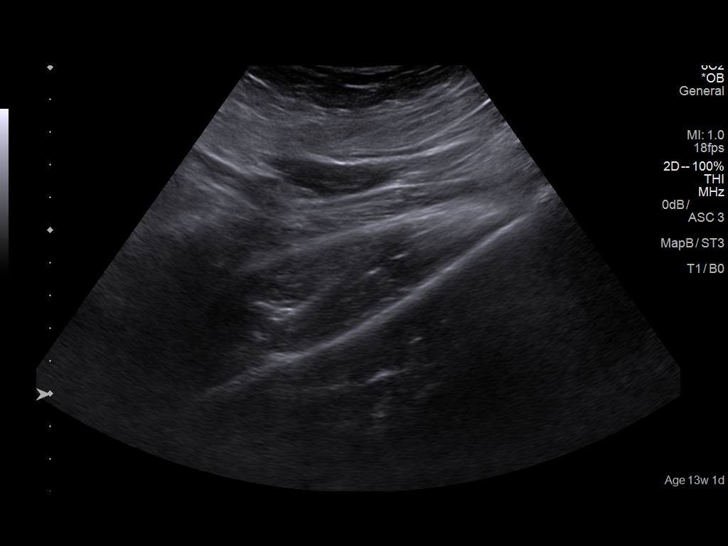
[im 23/32]
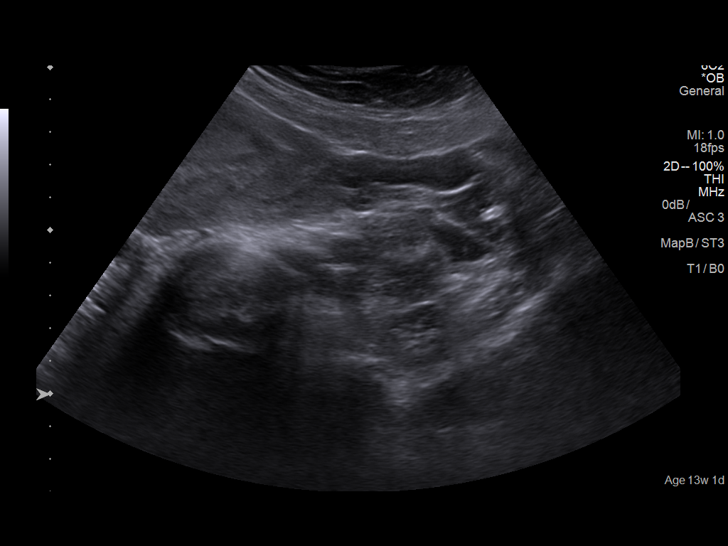
[im 26/32]
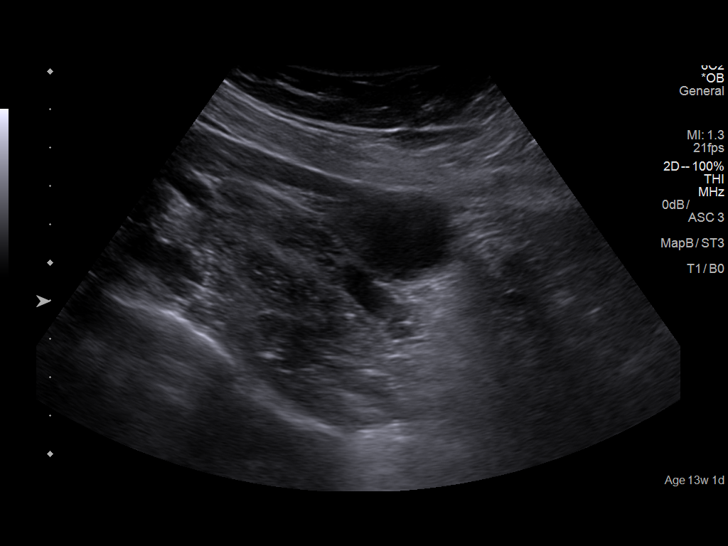
[im 28/32]
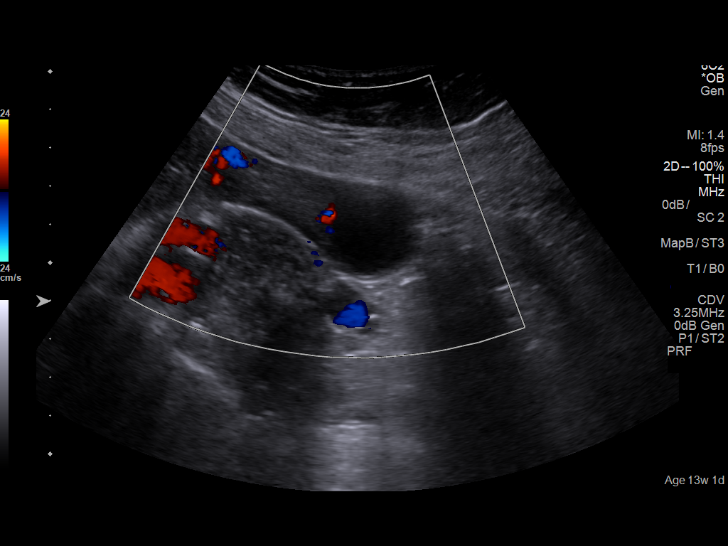
[im 30/32]
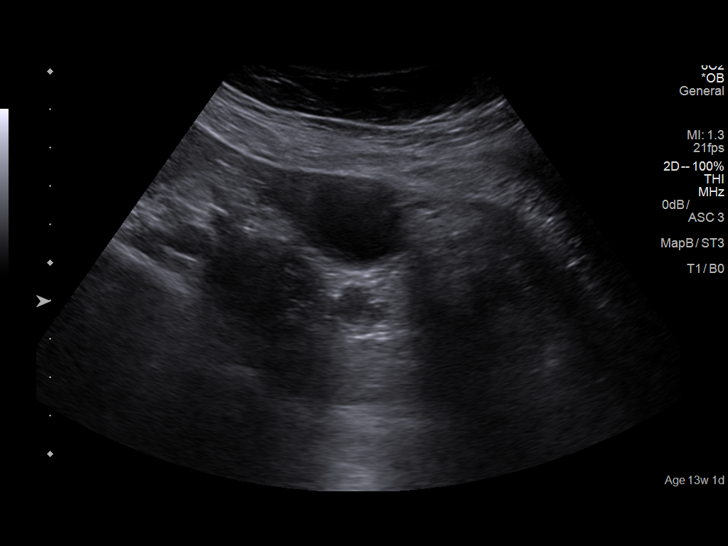

[13 of 28 positions shown; findings below may reference images not displayed]

IMPRESSION: Thank you for referring your patient for first trimester
screening . She had the opportunity to meet with our genetic
counselor today to discuss screening/testing options.  Please
see that note for details. She elected to have cell free fetal
DNA screening.

Ultrasound demonstrates a single live pregnancy at 13 weeks
1 day.  Dating is by today's ultrasound.

Fetal limbs, stomach, bladder, and symmetric choroid plexus
were seen today. The gestational age is too early for a
complete anatomic survey.

Maternal ovaries appear normal.
Serum will be drawn today.  We will contact her with results.

## 2019-07-04 IMAGING — US US MFM OB DETAIL+14 WK
1 series · 12 of 28 positions shown · non-contrast
Comparison: none

PATIENT INFO:

PERFORMED BY:
GERMAN PA
SERVICE(S) PROVIDED:
INDICATIONS:
18 weeks gestation of pregnancy
Advanced maternal age
FETAL EVALUATION:
Num Of Fetuses:     1
Fetal Heart         140
Rate(bpm):
Cardiac Activity:   Present
Presentation:       Vertex
Placenta:           Anterior
P. Cord Insertion:  Normal
BIOMETRY:
BPD:      44.6  mm     G. Age:  19w 3d         85  %    CI:        76.05   %    70 - 86
FL/HC:      16.3   %    16.1 -
HC:      162.1  mm     G. Age:  19w 0d         63  %    HC/AC:      1.26        1.09 -
AC:      128.8  mm     G. Age:  18w 3d         42  %    FL/BPD:     59.4   %
FL:       26.5  mm     G. Age:  18w 0d         26  %    FL/AC:      20.6   %    20 - 24
HUM:      25.3  mm     G. Age:  18w 0d         34  %
CER:        19  mm     G. Age:  18w 4d         48  %
NFT:       3.6  mm
CM:        4.6  mm
Est. FW:     237  gm      0 lb 8 oz     36  %
GESTATIONAL AGE:
LMP:           18w 1d        Date:  10/06/17                 EDD:   07/13/18
U/S Today:     18w 5d                                        EDD:   07/09/18
Best:          18w 4d     Det. By:  U/S C R L  (01/06/18)    EDD:   07/10/18
ANATOMY:
Cranium:               Within Normal Limits   Aortic Arch:            Normal appearance
Cavum:                 CSP visualized         Ductal Arch:            Normal appearance
Ventricles:            Normal appearance      Diaphragm:              Within Normal Limits
Choroid Plexus:        Within Normal Limits   Stomach:                Seen
Cerebellum:            Within Normal Limits   Abdomen:                Within Normal
Limits
Posterior Fossa:       Within Normal Limits   Abdominal Wall:         Normal appearance
Nuchal Fold:           Within Normal Limits   Cord Vessels:           3 vessels
Face:                  Orbits visualized      Kidneys:                Normal appearance
Lips:                  Normal appearance      Bladder:                Seen
Thoracic:              Within Normal Limits   Spine:                  Normal appearance
Heart:                 4-Chamber view         Upper Extremities:      Visualized
appears normal
RVOT:                  Normal appearance      Lower Extremities:      Visualized
LVOT:                  Normal appearance
CERVIX UTERUS ADNEXA:
Cervix
Length:           4.82  cm.
Comment:      Left ovarian simple cyst measures 1.9 x 1.7 x
2.2 cm

[Series 1: us mfm ob detail+14 wk · 0.22mm/px · 12 of 83 slices shown]
[im 4/83]
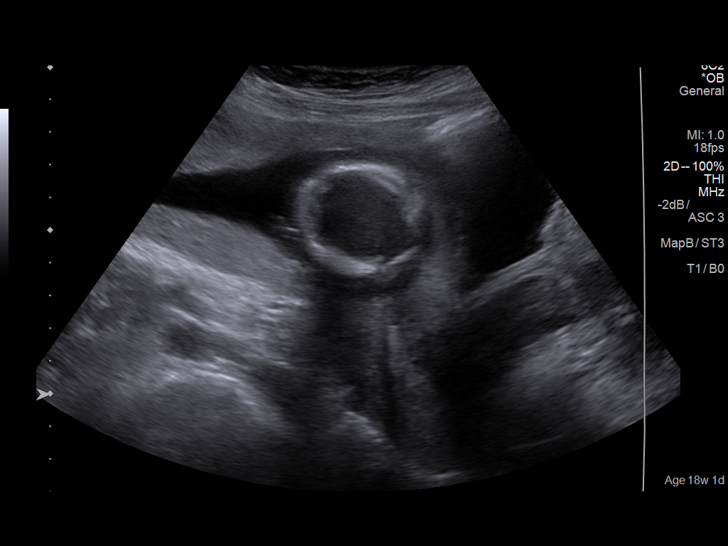
[im 10/83]
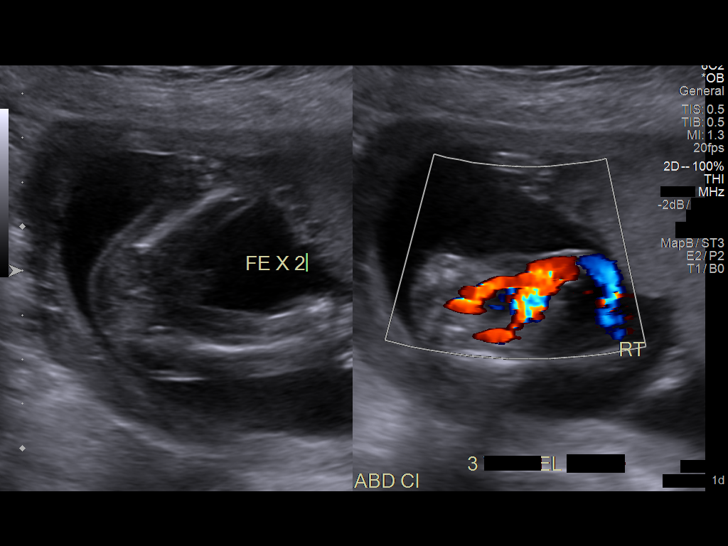
[im 16/83]
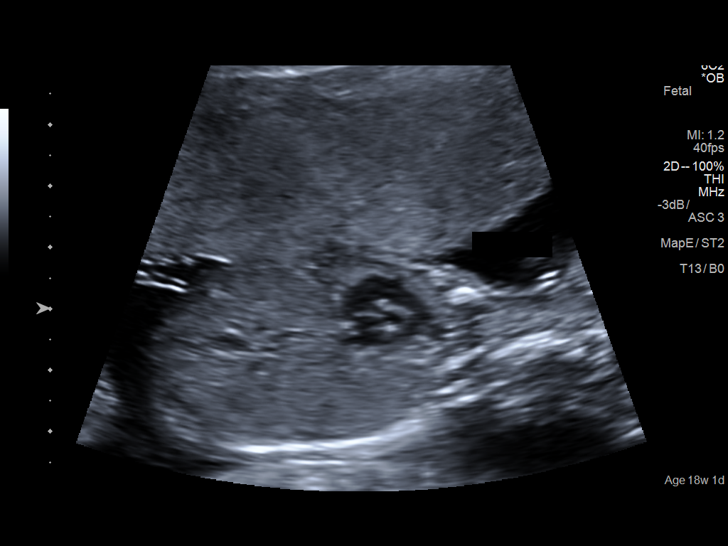
[im 25/83]
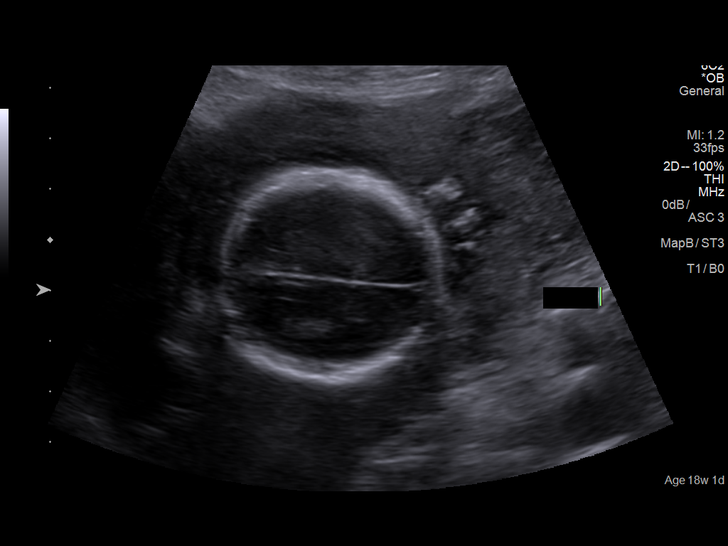
[im 31/83]
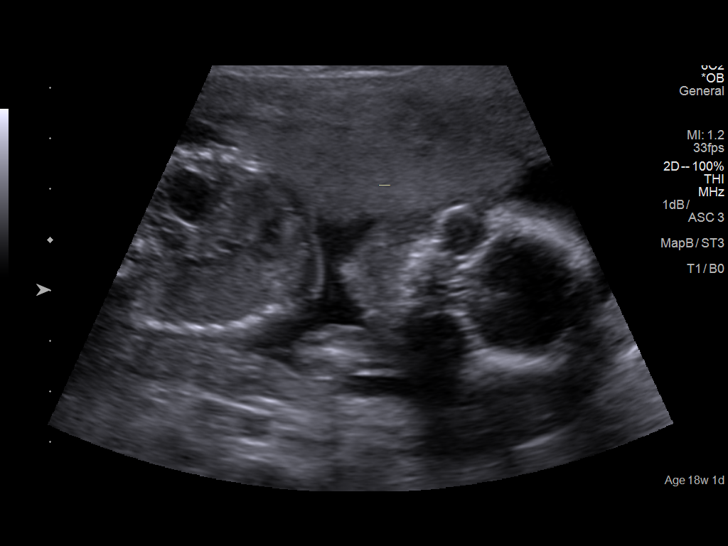
[im 37/83]
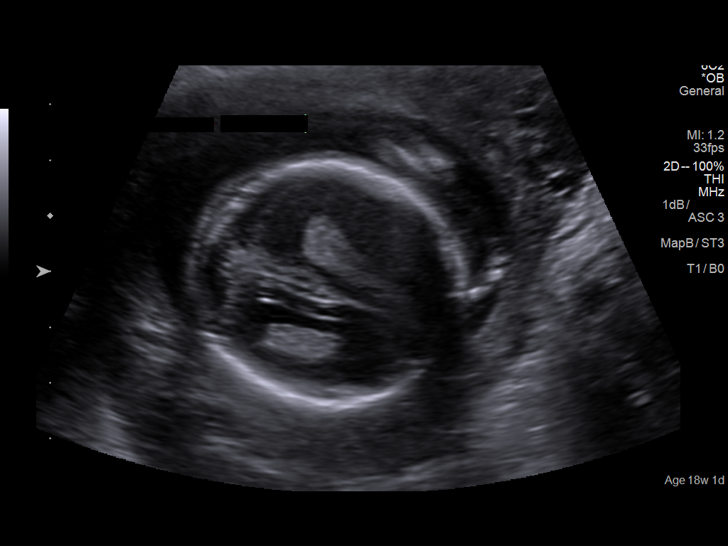
[im 46/83]
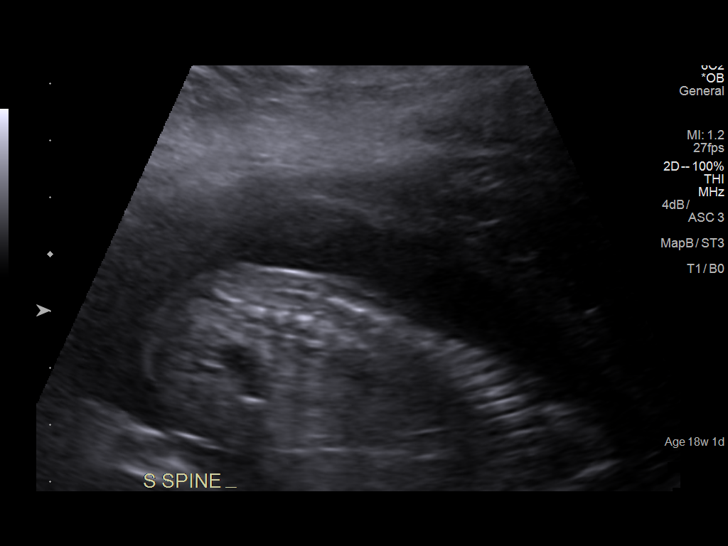
[im 52/83]
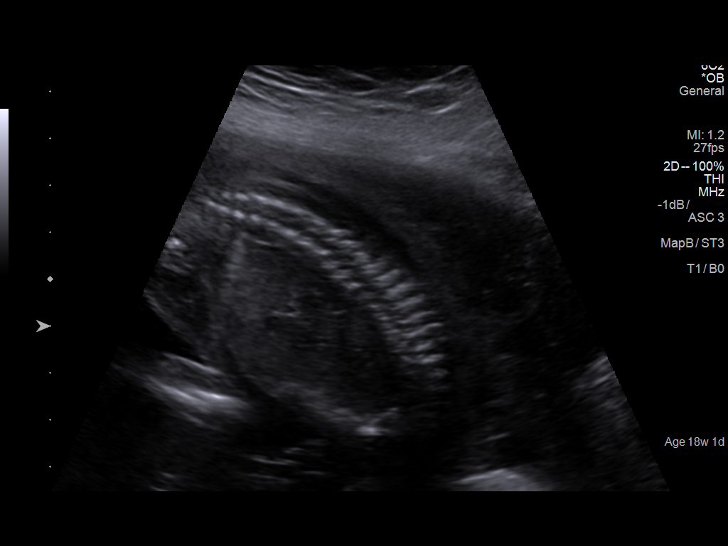
[im 58/83]
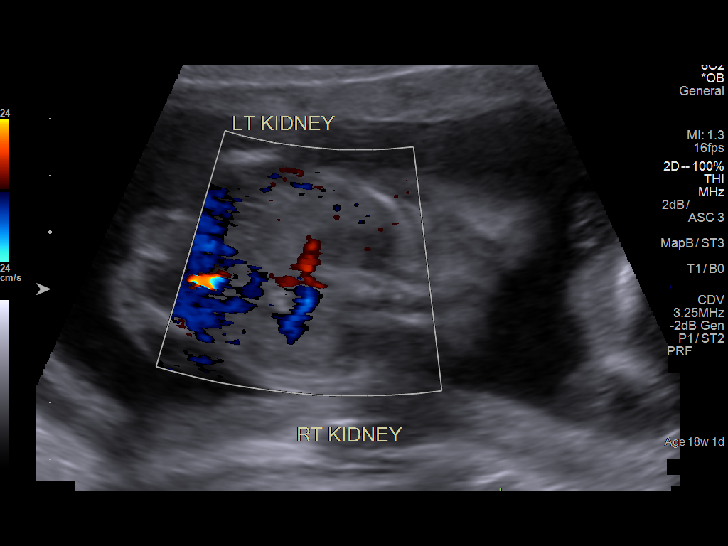
[im 67/83]
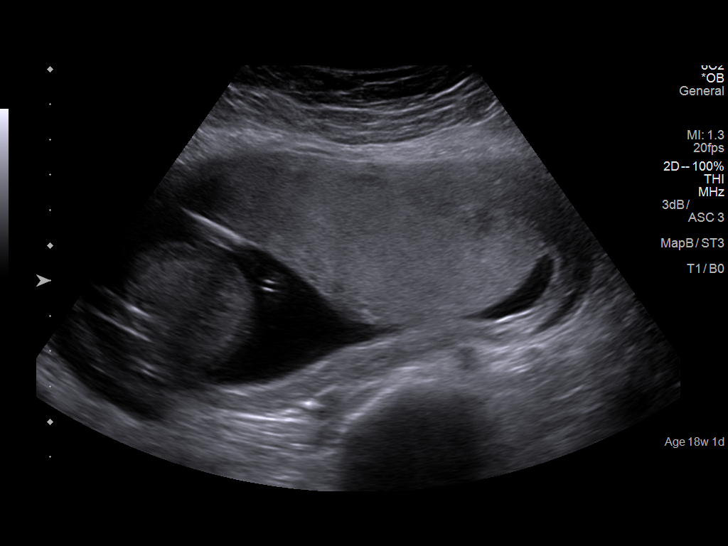
[im 73/83]
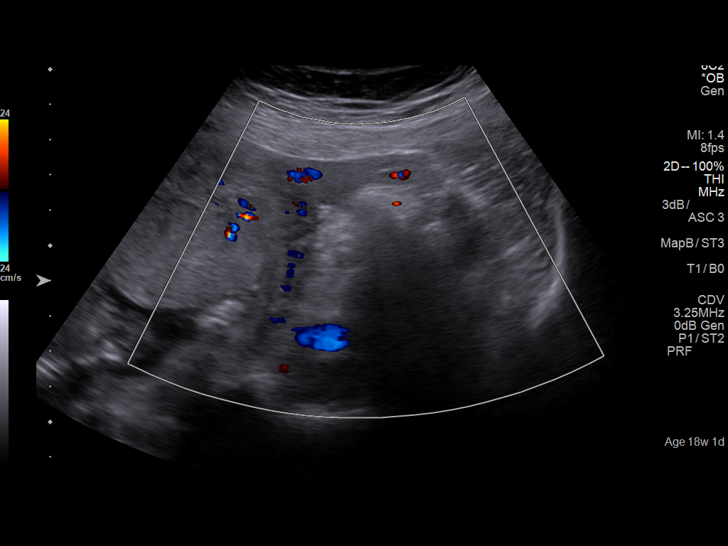
[im 79/83]
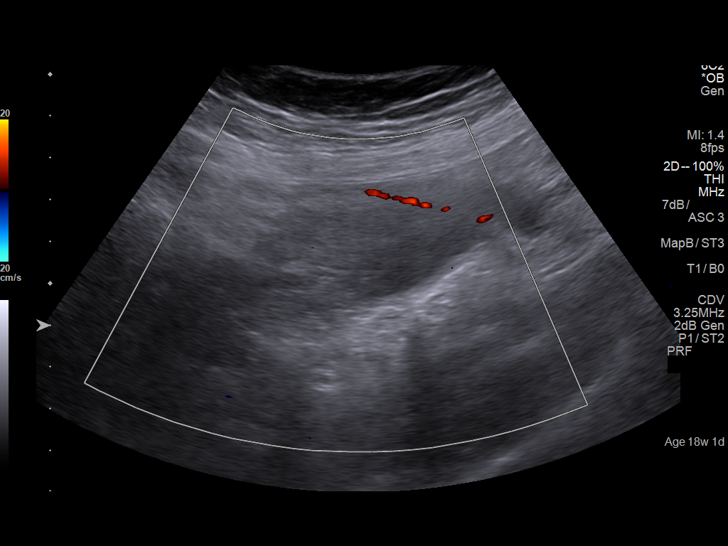

[12 of 28 positions shown; findings below may reference images not displayed]

IMPRESSION: Dear Ms. GERMAN,

Thank you for referring your patient to [facility] for a
fetal anatomical survey.  She has previously had cell free
fetal DNA testing which was negative for aneuploidy and
revealed a male karyotype.

performed here on 01/06/2018.

The fetal biometry correlates with established dating.

Detailed evaluation of the fetal anatomy was performed.  The
fetal anatomical survey appears within normal limits.   The
phenotypic sex is also male.

There is a small, 2 cm, simple cyst in the left ovary.

Thank you for allowing us to participate in your patient's care.

assistance.

## 2023-02-01 ENCOUNTER — Encounter: Payer: Self-pay | Admitting: Family Medicine

## 2023-02-01 ENCOUNTER — Ambulatory Visit: Payer: Self-pay | Admitting: Family Medicine

## 2023-02-01 VITALS — BP 109/73 | HR 82 | Ht 63.0 in | Wt 167.6 lb

## 2023-02-01 DIAGNOSIS — E663 Overweight: Secondary | ICD-10-CM

## 2023-02-01 DIAGNOSIS — Z3009 Encounter for other general counseling and advice on contraception: Secondary | ICD-10-CM

## 2023-02-01 DIAGNOSIS — Z0000 Encounter for general adult medical examination without abnormal findings: Secondary | ICD-10-CM

## 2023-02-01 NOTE — Progress Notes (Signed)
Pt is here for PE.  Pt declines STD testing today.  Condoms declined.  Berdie Ogren, RN

## 2023-02-01 NOTE — Progress Notes (Addendum)
[facility] DEPARTMENT [facility] [address] Main Number: [phone]  Family Planning Visit- Repeat Yearly Visit  Subjective:  [name] is a 42 y.o. G3P3003  being seen today for an annual wellness visit and to discuss contraception options.   The patient is currently using Female Condom for pregnancy prevention. Patient does not want a pregnancy in the next year.    report they are looking for a method that provides Does not want something inserted   Patient has the following medical problems: has Overweight on their problem list.  No chief complaint on file.   Patient reports to clinic for PE. States she uses female condoms to protect against pregnancy.   Patient denies concerns about self   See flowsheet for other program required questions.   Body mass index is 29.69 kg/m. - Patient is eligible for diabetes screening based on BMI> 25 and age >35?  yes HA1C ordered? yes  Patient reports 1 of partners in last year. Desires STI screening?  No - declined   Has patient been screened once for HCV in the past?  No  No results found for: "HCVAB"  Does the patient have current of drug use, have a partner with drug use, and/or has been incarcerated since last result? No  If yes-- Screen for HCV through [facility]   Does the patient meet criteria for HBV testing? No  Criteria:  -Household, sexual or needle sharing contact with HBV -History of drug use -HIV positive -Those with known Hep C   Health Maintenance Due  Topic Date Due   COVID-19 Vaccine (1) Never done   Hepatitis C Screening  Never done   DTaP/Tdap/Td (1 - Tdap) Never done    Review of Systems  Constitutional:  Negative for weight loss.  Eyes:  Negative for blurred vision.  Respiratory:  Negative for cough and shortness of breath.   Cardiovascular:  Negative for claudication.  Gastrointestinal:  Negative for nausea.  Genitourinary:  Negative for dysuria  and frequency.  Skin:  Negative for rash.  Neurological:  Positive for headaches.  Endo/Heme/Allergies:  Does not bruise/bleed easily.    The following portions of the patient's history were reviewed and updated as appropriate: allergies, current medications, past family history, past medical history, past social history, past surgical history and problem list. Problem list updated.  Objective:   Vitals:   02/01/23 0846  BP: 109/73  Pulse: 82  Weight: 167 lb 9.6 oz (76 kg)  Height: 5\' 3"  (1.6 m)    Physical Exam Constitutional:      Appearance: Normal appearance.  HENT:     Head: Normocephalic and atraumatic.  Pulmonary:     Effort: Pulmonary effort is normal.  Chest:  Breasts:    Tanner Score is 5.     Right: Normal. No mass, nipple discharge, skin change or tenderness.     Left: Normal. No mass, nipple discharge, skin change or tenderness.  Abdominal:     Palpations: Abdomen is soft.  Musculoskeletal:        General: Normal range of motion.  Skin:    General: Skin is warm and dry.  Neurological:     General: No focal deficit present.     Mental Status: She is alert.  Psychiatric:        Mood and Affect: Mood normal.        Behavior: Behavior normal.    Assessment and Plan:  [name] is a  42 y.o. female [phone] presenting to the [facility] Department for an yearly wellness and contraception visit  1. Well woman exam (no gynecological exam) -pap test last done 09/02/2018 (NILM and HPV negative) -next due in 09/03/2023 -no vaginal concerns- genital exam not indicated -never had a mammogram, given BCCCP pamphlet and encouraged to call for appointment -has about 4 HA a month- resolves with tylenol -gets dental care with [facility]  2. Overweight  - Hgb A1c w/o eAG  3. Family planning Contraception counseling: Reviewed options based on patient desire and reproductive life plan. Patient is interested in Female Condom. This was provided to  the patient today.   Risks, benefits, and typical effectiveness rates were reviewed.  Questions were answered.  Written information was also given to the patient to review.    The patient will follow up in  1 years for surveillance.  The patient was told to call with any further questions, or with any concerns about this method of contraception.  Emphasized use of condoms 100% of the time for STI prevention.  Patient was assessed for need for ECP. Not indicated   Return in about 1 year (around 02/01/2024) for annual well-woman exam.  No future appointments.  Lenice Llamas, [place]

## 2023-02-02 ENCOUNTER — Telehealth: Payer: Self-pay

## 2023-02-02 LAB — HGB A1C W/O EAG: Hgb A1c MFr Bld: 5.9 % — ABNORMAL HIGH (ref 4.8–5.6)

## 2023-02-02 NOTE — Telephone Encounter (Signed)
-----   Message from [place], [place] sent at 02/02/2023  2:00 PM EDT ----- Prediabetes. Please contact patient and encourage seeing a PCP.

## 2023-02-02 NOTE — Telephone Encounter (Signed)
LM for pt to return my call via [place]. Berdie Ogren, RN

## 2023-02-03 ENCOUNTER — Telehealth: Payer: Self-pay

## 2023-02-03 NOTE — Telephone Encounter (Signed)
-----   Message from Helena Middleton, FNP sent at 02/02/2023  2:00 PM EDT ----- Prediabetes. Please contact patient and encourage seeing a PCP. 

## 2023-02-03 NOTE — Telephone Encounter (Signed)
Pt notified of Hgb A1c results and informed to contact her PCP for follow-up Elita Quick [phone] from [facility] assisted with the call

## 2024-02-07 ENCOUNTER — Ambulatory Visit
Admission: RE | Admit: 2024-02-07 | Discharge: 2024-02-07 | Disposition: A | Discharge status: Home / Self Care | Payer: Self-pay | Source: Ambulatory Visit | Attending: Obstetrics and Gynecology | Admitting: Obstetrics and Gynecology

## 2024-02-07 ENCOUNTER — Other Ambulatory Visit: Payer: Self-pay | Admitting: Obstetrics and Gynecology

## 2024-02-07 ENCOUNTER — Ambulatory Visit: Payer: Self-pay | Attending: Obstetrics and Gynecology | Admitting: *Deleted

## 2024-02-07 VITALS — BP 96/74 | Wt 163.2 lb

## 2024-02-07 DIAGNOSIS — Z1239 Encounter for other screening for malignant neoplasm of breast: Secondary | ICD-10-CM

## 2024-02-07 DIAGNOSIS — Z1231 Encounter for screening mammogram for malignant neoplasm of breast: Secondary | ICD-10-CM | POA: Insufficient documentation

## 2024-02-07 NOTE — Patient Instructions (Addendum)
 Explained breast self awareness with Orville Blank. Patient refused Pap smear today. Patient stated she has an appointment scheduled at the [facility] Department for her Pap smear and unsure of the date. Let patient know Pap smears are covered with BCCCP and the importance of Pap smears. Let her know BCCCP will cover Pap smears and HPV typing every 5 years unless has a history of abnormal Pap smears. Referred patient to the [facility] for a screening mammogram. Appointment scheduled Monday, Feb 07, 2024 at 1040. Patient aware of appointment and will be there. Informed patient Tony Frederickson will follow up with her within the next couple of weeks with results to her mammogram by letter or phone. Orville Blank verbalized understanding.  Quantel Mcinturff, Dela Favor, RN 9:48 AM

## 2024-02-07 NOTE — Progress Notes (Signed)
 [name] is a 43 y.o. female who presents to [facility] clinic today with no complaints.    Pap Smear: Pap smear not completed today. Last Pap smear was 08/31/2018 at [facility] clinic and was normal with negative HPV per patient. Per patient has no history of an abnormal Pap smear. Last Pap smear result is not available in Epic.   Physical exam: Breasts Breasts symmetrical. No skin abnormalities bilateral breasts. No nipple retraction bilateral breasts. No nipple discharge bilateral breasts. No lymphadenopathy. No lumps palpated bilateral breasts. No complaints of pain or tenderness on exam.      Pelvic/Bimanual Pap is due today. Patient refused Pap smear today. Patient stated she has an appointment scheduled at the [facility] Department for her Pap smear and unsure of the date. Let patient know Pap smears are covered with BCCCP and the importance of Pap smears.    Smoking History: Patient has never smoked.   Patient Navigation: Patient education provided. Access to services provided for patient through [facility] program. Spanish interpreter [name] from [facility] provided.    Breast and Cervical Cancer Risk Assessment: Patient does not have family history of breast cancer, known genetic mutations, or radiation treatment to the chest before age 58. Patient does not have history of cervical dysplasia, immunocompromised, or DES exposure in-utero.  Risk Scores as of Encounter on 02/07/2024     [name]           5-year 0.54%   Lifetime 8.95%   This patient is [place]/Latina but has no documented birth country, so the [place] model used data from [place] patients to calculate their risk score. Document a birth country in the Demographics activity for a more accurate score.         Last calculated by Sharon Ingle, LPN on 1/61/0960 at  9:57 AM        A: BCCCP exam with pap smear No complaints.  P: Referred patient to the [facility] for a  screening mammogram. Appointment scheduled Monday, Feb 07, 2024 at 1040.  Sharon Edge, RN 02/07/2024 9:48 AM

## 2024-02-10 ENCOUNTER — Ambulatory Visit: Payer: Self-pay | Admitting: Obstetrics & Gynecology
